# Patient Record
Sex: Male | Born: 1975 | Race: Black or African American | Hispanic: No | Marital: Married | Smoking: Former smoker
Health system: Southern US, Community
[De-identification: ages and names within clinical notes are randomized; demographics above are authoritative.]

## PROBLEM LIST (undated history)

## (undated) DIAGNOSIS — R569 Unspecified convulsions: Secondary | ICD-10-CM

## (undated) DIAGNOSIS — I639 Cerebral infarction, unspecified: Secondary | ICD-10-CM

## (undated) HISTORY — PX: ROTATOR CUFF REPAIR: SHX139

---

## 2012-01-31 ENCOUNTER — Emergency Department (HOSPITAL_COMMUNITY): Payer: BC Managed Care – PPO

## 2012-01-31 ENCOUNTER — Inpatient Hospital Stay (HOSPITAL_BASED_OUTPATIENT_CLINIC_OR_DEPARTMENT_OTHER)
Admission: EM | Admit: 2012-01-31 | Discharge: 2012-02-04 | DRG: 810 | Disposition: A | Discharge status: Home / Self Care | Payer: BC Managed Care – PPO | Attending: Neurosurgery | Admitting: Neurosurgery

## 2012-01-31 ENCOUNTER — Emergency Department (HOSPITAL_BASED_OUTPATIENT_CLINIC_OR_DEPARTMENT_OTHER): Payer: BC Managed Care – PPO

## 2012-01-31 ENCOUNTER — Encounter (HOSPITAL_BASED_OUTPATIENT_CLINIC_OR_DEPARTMENT_OTHER): Payer: Self-pay | Admitting: *Deleted

## 2012-01-31 ENCOUNTER — Inpatient Hospital Stay (HOSPITAL_COMMUNITY): Payer: BC Managed Care – PPO

## 2012-01-31 DIAGNOSIS — R569 Unspecified convulsions: Secondary | ICD-10-CM | POA: Diagnosis not present

## 2012-01-31 DIAGNOSIS — G936 Cerebral edema: Secondary | ICD-10-CM | POA: Diagnosis present

## 2012-01-31 DIAGNOSIS — R4701 Aphasia: Secondary | ICD-10-CM | POA: Diagnosis present

## 2012-01-31 DIAGNOSIS — I619 Nontraumatic intracerebral hemorrhage, unspecified: Principal | ICD-10-CM | POA: Diagnosis present

## 2012-01-31 DIAGNOSIS — R29810 Facial weakness: Secondary | ICD-10-CM | POA: Diagnosis present

## 2012-01-31 DIAGNOSIS — G9389 Other specified disorders of brain: Secondary | ICD-10-CM | POA: Diagnosis present

## 2012-01-31 DIAGNOSIS — F17200 Nicotine dependence, unspecified, uncomplicated: Secondary | ICD-10-CM | POA: Diagnosis present

## 2012-01-31 LAB — PROTIME-INR: INR: 0.91 (ref 0.00–1.49)

## 2012-01-31 LAB — COMPREHENSIVE METABOLIC PANEL
AST: 27 U/L (ref 0–37)
Albumin: 4 g/dL (ref 3.5–5.2)
Alkaline Phosphatase: 58 U/L (ref 39–117)
BUN: 16 mg/dL (ref 6–23)
BUN: 16 mg/dL (ref 6–23)
CO2: 25 mEq/L (ref 19–32)
Calcium: 9.1 mg/dL (ref 8.4–10.5)
Chloride: 102 mEq/L (ref 96–112)
GFR calc Af Amer: >90 mL/min (ref >90)
GFR calc non Af Amer: 76 mL/min — ABNORMAL LOW (ref >90)
Glucose, Bld: 102 mg/dL — ABNORMAL HIGH (ref 70–99)
Potassium: 3.5 mEq/L (ref 3.5–5.1)
Total Bilirubin: 0.2 mg/dL — ABNORMAL LOW (ref 0.3–1.2)
Total Protein: 7.1 g/dL (ref 6.0–8.3)

## 2012-01-31 LAB — DIFFERENTIAL
Basophils Relative: 0 % (ref 0–1)
Lymphocytes Relative: 32 % (ref 12–46)
Monocytes Relative: 6 % (ref 3–12)
Neutro Abs: 5.8 10*3/uL (ref 1.7–7.7)
Neutrophils Relative %: 58 % (ref 43–77)

## 2012-01-31 LAB — RAPID URINE DRUG SCREEN, HOSP PERFORMED
Amphetamines: NOT DETECTED
Benzodiazepines: NOT DETECTED
Opiates: NOT DETECTED
Tetrahydrocannabinol: NOT DETECTED

## 2012-01-31 LAB — CBC
HCT: 41.4 % (ref 39.0–52.0)
Hemoglobin: 14.4 g/dL (ref 13.0–17.0)
MCHC: 34.8 g/dL (ref 30.0–36.0)
RBC: 4.69 MIL/uL (ref 4.22–5.81)
WBC: 9.9 10*3/uL (ref 4.0–10.5)

## 2012-01-31 LAB — MRSA PCR SCREENING: MRSA by PCR: POSITIVE — AB

## 2012-01-31 LAB — CK TOTAL AND CKMB (NOT AT ARMC): Total CK: 499 U/L — ABNORMAL HIGH (ref 7–232)

## 2012-01-31 LAB — APTT: aPTT: 31 seconds (ref 24–37)

## 2012-01-31 LAB — GLUCOSE, CAPILLARY: Glucose-Capillary: 110 mg/dL — ABNORMAL HIGH (ref 70–99)

## 2012-01-31 MED ORDER — SODIUM CHLORIDE 0.9 % IJ SOLN
3.0000 mL | INTRAMUSCULAR | Status: AC
  Filled 2012-01-31: qty 3

## 2012-01-31 MED ORDER — SODIUM CHLORIDE 0.9 % IV SOLN
INTRAVENOUS | Status: DC
  Administered 2012-02-01 – 2012-02-02 (×2): via INTRAVENOUS
  Administered 2012-02-03: 50 mL/h via INTRAVENOUS

## 2012-01-31 MED ORDER — PANTOPRAZOLE SODIUM 40 MG IV SOLR
40.0000 mg | Freq: Every day | INTRAVENOUS | Status: DC
  Administered 2012-01-31: 40 mg via INTRAVENOUS
  Filled 2012-01-31 (×2): qty 40

## 2012-01-31 MED ORDER — SODIUM CHLORIDE 0.9 % IV SOLN
500.0000 mg | Freq: Two times a day (BID) | INTRAVENOUS | Status: DC
  Administered 2012-01-31 – 2012-02-01 (×3): 500 mg via INTRAVENOUS
  Filled 2012-01-31 (×4): qty 5

## 2012-01-31 MED ORDER — SODIUM CHLORIDE 0.9 % IJ SOLN
3.0000 mL | Freq: Two times a day (BID) | INTRAMUSCULAR | Status: DC
  Administered 2012-01-31 (×2): 3 mL via INTRAVENOUS
  Administered 2012-02-01: 10 mL via INTRAVENOUS
  Administered 2012-02-01: 3 mL via INTRAVENOUS
  Administered 2012-02-02: 10 mL via INTRAVENOUS
  Administered 2012-02-02 – 2012-02-04 (×2): 3 mL via INTRAVENOUS

## 2012-01-31 MED ORDER — DEXAMETHASONE SODIUM PHOSPHATE 4 MG/ML IJ SOLN
2.0000 mg | Freq: Four times a day (QID) | INTRAMUSCULAR | Status: DC
  Administered 2012-01-31 – 2012-02-03 (×13): 2 mg via INTRAVENOUS
  Filled 2012-01-31 (×4): qty 0.5
  Filled 2012-01-31 (×2): qty 1
  Filled 2012-01-31 (×2): qty 0.5
  Filled 2012-01-31: qty 1
  Filled 2012-01-31 (×2): qty 0.5
  Filled 2012-01-31 (×2): qty 1
  Filled 2012-01-31 (×4): qty 0.5

## 2012-01-31 MED ORDER — LABETALOL HCL 5 MG/ML IV SOLN: 10.0000 mg | INTRAVENOUS | Status: DC | PRN

## 2012-01-31 MED ORDER — ACETAMINOPHEN 325 MG PO TABS: 650.0000 mg | ORAL_TABLET | ORAL | Status: DC | PRN

## 2012-01-31 MED ORDER — DIAZEPAM 5 MG PO TABS
5.0000 mg | ORAL_TABLET | Freq: Once | ORAL | Status: AC
  Administered 2012-01-31: 5 mg via ORAL

## 2012-01-31 MED ORDER — SENNOSIDES-DOCUSATE SODIUM 8.6-50 MG PO TABS
1.0000 | ORAL_TABLET | Freq: Two times a day (BID) | ORAL | Status: DC
  Administered 2012-01-31 – 2012-02-04 (×6): 1 via ORAL
  Filled 2012-01-31 (×8): qty 1

## 2012-01-31 MED ORDER — SODIUM CHLORIDE 0.9 % IV SOLN
1000.0000 mg | Freq: Once | INTRAVENOUS | Status: AC
  Administered 2012-01-31: 1000 mg via INTRAVENOUS
  Filled 2012-01-31: qty 20

## 2012-01-31 MED ORDER — LORAZEPAM 2 MG/ML IJ SOLN
INTRAMUSCULAR | Status: AC
  Administered 2012-01-31: 01:00:00
  Filled 2012-01-31: qty 1

## 2012-01-31 MED ORDER — ACETAMINOPHEN 650 MG RE SUPP: 650.0000 mg | RECTAL | Status: DC | PRN

## 2012-01-31 MED ORDER — GADOBENATE DIMEGLUMINE 529 MG/ML IV SOLN
20.0000 mL | Freq: Once | INTRAVENOUS | Status: AC
  Administered 2012-01-31: 20 mL via INTRAVENOUS

## 2012-01-31 MED ORDER — DIAZEPAM 5 MG PO TABS
ORAL_TABLET | ORAL | Status: AC
  Administered 2012-01-31: 5 mg
  Filled 2012-01-31: qty 1

## 2012-01-31 MED ORDER — DIAZEPAM 5 MG PO TABS
ORAL_TABLET | ORAL | Status: AC
  Filled 2012-01-31: qty 1

## 2012-01-31 MED ORDER — ONDANSETRON HCL 4 MG/2ML IJ SOLN: 4.0000 mg | Freq: Four times a day (QID) | INTRAMUSCULAR | Status: DC | PRN

## 2012-01-31 NOTE — Progress Notes (Signed)
Chaplain responded to page for patient being brought in to [facility] from [facility].  Patient calm and resting.  Wife states they have strong spiritual support...3 of their parents are ministers. Offered refreshments and support. Patient being admitted. Will follow up as needed.

## 2012-01-31 NOTE — Evaluation (Signed)
SLP has reviewed and agrees with student's note below.  Duane Smith Duane Smith M.Ed CCC-SLP Pager [phone]  01/31/2012  

## 2012-01-31 NOTE — ED Notes (Addendum)
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 While in transit with [facility] pt seized and was given dilatin and ativan. +r facial droop  GCS 15.

## 2012-01-31 NOTE — Evaluation (Signed)
Speech Language Pathology Evaluation Patient Details Name: Duane Smith MRN: [id number] DOB: 22-Jan-1976 Today's Date: 01/31/2012 Time: 1100-1115 SLP Time Calculation (min): 15 min  Problem List: There is no problem list on file for this patient.  Past Medical History: History reviewed. No pertinent past medical history. Past Surgical History:  Past Surgical History  Procedure Date  . Rotator cuff repair    HPI:  Duane Smith is a 36 y.o. male who presented to the Emergency Department complaining of sudden, progressively worsening, aphasia onset yesterday (10:30PM). 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  MRI pending.  The pt reports he smoked one cigar and drank four beers yesterday evening (01/30/12) while at a friends residence. The pt informs he experienced a period of inability to articulate his words and speak normally which last 20 seconds in duration. Following the first experience, the pt returned home, where he reports he experienced a second episode of aphasia at 11:45PM, which prompted his visit to [facility].     Assessment / Plan / Recommendation Clinical Impression  Pt. seen for speech-language-cognitive assessment.  Pt. is drowsy after having Valium in preparation for MRI today.  Per pt., pt.'s wife and mother, speech/language/cognition is back to baseline. Cognition appeared WFL at bediside with tasks assessed.  Pt. presents with a right side facial droop and reduced min-mild dysarthria. Overall, facial droop/speech intelligibility did not decrease his ability to communicate effectively.  SLP will provide oral-motor exercises to practice independently.  No ST recommended at this time.      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[employer])   Cognition  Overall Cognitive Status: Appears within functional limits for tasks assessed Arousal/Alertness: Awake/alert Orientation Level: Oriented X4 Attention: Sustained Memory: Appears intact Awareness: Appears intact Problem Solving: Appears intact Safety/Judgment: Appears intact    Comprehension  Auditory Comprehension Overall Auditory Comprehension: Appears within functional limits for tasks assessed Yes/No Questions: Within Functional Limits Commands: Within Functional Limits Conversation: Simple Visual Recognition/Discrimination Discrimination: Not tested Reading Comprehension Reading Status: Not tested    Expression Expression Primary Mode of Expression: Verbal Verbal Expression Overall Verbal Expression: Appears within functional limits for tasks assessed Initiation: No impairment Level of Generative/Spontaneous Verbalization: Conversation Repetition: No impairment Naming: No impairment Pragmatics: No impairment Written Expression Written Expression: Not tested   Oral / Motor Oral Motor/Sensory Function Overall Oral Motor/Sensory Function: Impaired Labial ROM: Reduced right Labial Symmetry: Abnormal symmetry right Labial Strength: Within Functional Limits Labial Sensation: Within Functional Limits Lingual ROM: Within Functional Limits Lingual Symmetry: Within Functional Limits Lingual Strength: Within Functional Limits Lingual Sensation: Within Functional Limits Facial ROM: Reduced right Facial Symmetry: Right droop Facial Strength: Reduced Facial Sensation: Reduced Velum: Within Functional Limits Mandible: Within Functional Limits Motor Speech Overall Motor Speech: Appears within functional limits for tasks assessed Respiration: Within functional limits Phonation: Normal Resonance: Within functional limits Articulation: Within functional  limitis Intelligibility: Intelligibility reduced Word: 75-100% accurate Phrase: 75-100% accurate Sentence: 75-100% accurate Conversation: 75-100% accurate Motor Planning: Witnin functional limits   GO     Milagros Loll, Lynette Topete 01/31/2012, 1:02 PM

## 2012-01-31 NOTE — ED Provider Notes (Addendum)
History   This chart was scribed for Shuntel Fishburn Smitty Cords, MD by Sofie Rower. The patient was seen in room MH10/MH10 and the patient's care was started at 12:46AM  Level 5 Caveat: Immediate Medical Intervention.    CSN: [id number]  Arrival date & time 01/31/12  Duane Smith   First MD Initiated Contact with Patient 01/31/12 [phone]      Chief Complaint  Patient presents with  . Aphasia    (Consider location/radiation/quality/duration/timing/severity/associated sxs/prior treatment) The history is provided by the patient and a friend. The history is limited by the condition of the patient (need for urgent intervention). No language interpreter was used.    Duane Smith is a 36 y.o. male who presents to the Emergency Department complaining of sudden, progressively worsening, aphasia onset yesterday (10:30PM). The pt reports he smoked one cigar and drank four beers yesterday evening (01/30/12) while at a friends residence. The pt informs he experienced a period of inability to articulate his words and speak normally which last 20 seconds in duration. Following the first experience, the pt returned home, where he reports he experienced a second episode of aphasia at 11:45PM, which prompted his visit to [facility].  The pt denies headache, neck pain, trauma, abdominal pain, nausea, vomiting, difficulty breathing, visual difficulty, and difficulty speaking.       History reviewed. No pertinent past medical history.  History reviewed. No pertinent past surgical history.  No family history on file.  History  Substance Use Topics  . Smoking status: Current Everyday Smoker  . Smokeless tobacco: Not on file  . Alcohol Use: Yes     occ      Review of Systems  Unable to perform ROS Neurological: Positive for facial asymmetry and speech difficulty.  All other systems reviewed and are negative.    Allergies  Review of patient's allergies indicates no known allergies.  Home Medications  No current  outpatient prescriptions on file.  BP 155/91  Pulse 100  Temp 97.8 F (36.6 C) (Oral)  Resp 18  Ht 5\' 9"  (1.753 m)  Wt 245 lb (111.131 kg)  BMI 36.18 kg/m2  SpO2 98%  Physical Exam  Nursing note and vitals reviewed. Constitutional: He is oriented to person, place, and time. He appears well-developed and well-nourished.  HENT:  Head: Normocephalic and atraumatic.  Nose: Nose normal.  Mouth/Throat: Oropharynx is clear and moist.  Eyes: Conjunctivae and EOM are normal. Pupils are equal, round, and reactive to light.  Neck: Normal range of motion. Neck supple.  Cardiovascular: Normal rate, regular rhythm and normal heart sounds.   Pulmonary/Chest: Effort normal and breath sounds normal.  Abdominal: Soft. Bowel sounds are normal. There is no tenderness. There is no rebound and no guarding.  Musculoskeletal: Normal range of motion.  Neurological: He is alert and oriented to person, place, and time. He has normal reflexes. He displays normal reflexes.       5/5 strength detected at all extremities. Facial droop located at the right side of the face.  Tick of the tongue  Skin: Skin is warm and dry.  Psychiatric: He has a normal mood and affect. His behavior is normal.    ED Course  Procedures (including critical care time)  DIAGNOSTIC STUDIES: Oxygen Saturation is 98% on room air, normal by my interpretation.    COORDINATION OF CARE:    12:50AM- Treatment plan discussed with patient. Pt agrees to treatment.   Labs Reviewed - No data to display No results found.   No diagnosis  found.    MDM  Case D/w Dr. Roseanne Reno of neurology will see in the ED cancel code stroke send to ED.  Spoke with Dr. Roseanne Reno 2 additional times to update him on patient's condition  On the way out of the trauma bay patient was witnessed to have had 20-30 second tonic clonic seizure.  Resolved,  2 mg ativan given fosphenytoin loaded.  Recontacted Dr/ Roseanne Reno of neurology.    Updated Dr. Oletta Lamas in the  ED regarding patient's condition as [facility] left building regarding change in patients status  Attempted call to Dr. Blanche East but unable to complete due to active seizure.  Spoke with Dr.  Blanche East later following transfer regarding patient's symptoms.  He would like chest XRAY UDS and LFTs added  I personally performed the services described in this documentation, which was scribed in my presence. The recorded information has been reviewed and considered.   Date: 01/31/2012  Rate: 103  Rhythm: sinus tachycardia  QRS Axis: normal  Intervals: normal  ST/T Wave abnormalities: normal  Conduction Disutrbances:none  Narrative Interpretation:   Old EKG Reviewed: none available  MDM Reviewed: nursing note and vitals Interpretation: labs, ECG and CT scan Total time providing critical care: 30-74 minutes. This excludes time spent performing separately reportable procedures and services. Consults: neurology and neurosurgery  CRITICAL CARE Performed by: Jasmine Awe   Total critical care time: 30 minutes  Critical care time was exclusive of separately billable procedures and treating other patients.  Critical care was necessary to treat or prevent imminent or life-threatening deterioration.  Critical care was time spent personally by me on the following activities: development of treatment plan with patient and/or surrogate as well as nursing, discussions with consultants, evaluation of patient's response to treatment, examination of patient, obtaining history from patient or surrogate, ordering and performing treatments and interventions, ordering and review of laboratory studies, ordering and review of radiographic studies, pulse oximetry and re-evaluation of patient's condition.       Jasmine Awe, MD 01/31/12 [phone]

## 2012-01-31 NOTE — H&P (Signed)
Subjective: Duane Smith is a 36 y.o. male who presented to the Emergency Department complaining of sudden, progressively worsening, aphasia onset yesterday (10:30PM). The pt reports he smoked one cigar and drank four beers yesterday evening (01/30/12) while at a friends residence. The pt informs he experienced a period of inability to articulate his words and speak normally which last 20 seconds in duration. Following the first experience, the pt returned home, where he reports he experienced a second episode of aphasia at 11:45PM, which prompted his visit to [facility].  The pt denies headache, neck pain, trauma, abdominal pain, nausea, vomiting, difficulty breathing, visual difficulty, and difficulty speaking. Pt c/o right facial weakness - had 1 sz in ER   There are no active problems to display for this patient.  History reviewed. No pertinent past medical history.  Past Surgical History  Procedure Date  . Rotator cuff repair     No prescriptions prior to admission   No Known Allergies  History  Substance Use Topics  . Smoking status: Current Everyday Smoker  . Smokeless tobacco: Not on file  . Alcohol Use: Yes     occ    No family history on file.  Review of Systems Otherwise negative  Objective: Vital signs in last 24 hours: Temp:  [97.8 F (36.6 C)-98.3 F (36.8 C)] 98.2 F (36.8 C) (09/09 0400) Pulse Rate:  [38-105] 89  (09/09 0500) Resp:  [15-26] 23  (09/09 0500) BP: (97-155)/(50-98) 112/69 mmHg (09/09 0500) SpO2:  [79 %-100 %] 99 % (09/09 0500) Weight:  [111.131 kg (245 lb)-117 kg (257 lb 15 oz)] 117 kg (257 lb 15 oz) (09/09 0400)  AAOx3, speech sl slurred - right facial weakness - otherwise CN  Intact Motor - 5/5, sensation intact , no drift  Data Review Lab Results  Component Value Date   WBC 9.9 01/31/2012   HGB 14.4 01/31/2012   HCT 41.4 01/31/2012   PLT 247 01/31/2012   GLUCOSE 102* 01/31/2012   ALT 23 01/31/2012   AST 32 01/31/2012   NA 140 01/31/2012   K 4.0 01/31/2012   CL  104 01/31/2012   CREATININE 1.12 01/31/2012   BUN 16 01/31/2012   CO2 26 01/31/2012   INR 0.91 01/31/2012     CT HEAD WITHOUT CONTRAST  Technique: Contiguous axial images were obtained from the base of  the skull through the vertex without contrast.  Comparison: None.  Findings: 2.1 cm rounded mass in the posterior left frontal lobe  with a mild amount of adjacent white matter low density. This is  predominately high density, measuring up to 87 HU in density.  There is an anterior low density fluid component as well. There is  minimal shift of midline structures to the right, measuring 1 mm.  The ventricles are normal in size. No additional intracranial  abnormalities are seen. Unremarkable bones. Left maxillary and  bilateral ethmoid sinus mucosal thickening. There is also a small  amount of left frontal sinus mucosal thickening or retained  secretions.  IMPRESSION:  1. 2.1 cm rounded hemorrhagic and cystic mass in the posterior  left frontal lobe. 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 Further evaluation with  pre and postcontrast magnetic resonance imaging is recommended.  This was discussed with Dr. Terressa Koyanagi at 0104 hours.  2. 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    Assessment/Plan:  Pt with Left frontal ICH , ? Into underlying mass -  Admit icu - get MRI   Clydene Fake, MD 01/31/2012 7:11 AM

## 2012-01-31 NOTE — ED Provider Notes (Signed)
MSE performed.   Brain mass from [facility].  I spoke to Dr Nicanor Alcon and she has already spoke with NEU and NSG who are coming to see PT.  2:19 AM I spoke to Dr Delbert Phenix NSG admit.   PT evaluated, is on Dilantin for Sz PTA. Now is awake alert no focal deficits, speech clear, MAE x 4.   CXR and labs added and plan NSG admit  2:45 AM d/w Dr Phoebe Perch, requests admit to 3100 ICU and keppra 500mg  Q 12 and MR brain with and without  Sunnie Nielsen, MD 01/31/12 0246

## 2012-01-31 NOTE — ED Notes (Addendum)
Pt. States that he was over at a friends house this evening. States he had a few drinks of etoh. States at 1030pm he could not "get his words out" states this lasted  20 seconds and he was able to talk. When he got home around 2345 he had another episode. Pt. Presents with a right sided facial droop. Speech is clear at present. Grips equal bilateral on exam. Pupils 2 brisk bilateral. Code Stroke called from Triage. Called Dr. Nicanor Alcon to assess pt. In triage.

## 2012-02-01 ENCOUNTER — Inpatient Hospital Stay (HOSPITAL_COMMUNITY): Payer: BC Managed Care – PPO

## 2012-02-01 MED ORDER — LEVETIRACETAM 500 MG PO TABS
500.0000 mg | ORAL_TABLET | Freq: Two times a day (BID) | ORAL | Status: DC
  Filled 2012-02-01 (×2): qty 1

## 2012-02-01 MED ORDER — LEVETIRACETAM 500 MG PO TABS
1000.0000 mg | ORAL_TABLET | Freq: Two times a day (BID) | ORAL | Status: DC
  Administered 2012-02-01: 1000 mg via ORAL
  Filled 2012-02-01 (×3): qty 2

## 2012-02-01 MED ORDER — PANTOPRAZOLE SODIUM 40 MG PO TBEC
40.0000 mg | DELAYED_RELEASE_TABLET | Freq: Every day | ORAL | Status: DC
  Administered 2012-02-01 – 2012-02-03 (×3): 40 mg via ORAL
  Filled 2012-02-01 (×4): qty 1

## 2012-02-01 MED ORDER — SODIUM CHLORIDE 0.9 % IV SOLN
1000.0000 mg | INTRAVENOUS | Status: AC
  Administered 2012-02-01: 1000 mg via INTRAVENOUS
  Filled 2012-02-01: qty 10

## 2012-02-01 NOTE — Evaluation (Signed)
Clinical/Bedside Swallow Evaluation Patient Details  Name: Duane Smith MRN: [id number] Date of Birth: 1975/11/12  Today's Date: 02/01/2012 Time: 1415-1430 SLP Time Calculation (min): 15 min  Past Medical History: History reviewed. No pertinent past medical history. Past Surgical History:  Past Surgical History  Procedure Date  . Rotator cuff repair    HPI:  Duane Smith is a 36 y.o. male who presented to the Emergency Department complaining of sudden, progressively worsening, aphasia onset yesterday (10:30PM). 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  MRI pending. The pt informs he experienced a period of inability to articulate his words and speak normally which last 20 seconds in duration. Following the first experience, the pt returned home, where he reports he experienced a second episode of aphasia at 11:45PM, which prompted his visit to [facility].  RN received BSE due to right labial leakage when drinking and difficulty masticating on right side oral cavity.  Experiencing increased transient difficulty expressing himself today.     Assessment / Plan / Recommendation Clinical Impression  Pt. demonstrates decreased right labial ROM and sensation and reported spillage from right side while drinking and difficulty biting his sandwhich at lunch.  Pt. independently compensates by masticating on the left side of oral cavity and drinking from cup on right side.  No s/s penetration or aspiration during assessment.  SLP educated pt. to continue swallow precautions.  Recommending he continue a regular texture diet and thin liquids.  No follow up ST was recommended yesterday after speech-cognitive assessement.  SLP/pt./RN noted increased and inconsistent expressing his thoughts and increased dysarthria today.  SLP will follow up for dysphagia and request repeat speech assessment if needed.  SLP will give oral-motor exercises for independent practice.    Aspiration Risk  Mild    Diet Recommendation  Regular;Thin liquid   Liquid Administration via: Cup;Straw Medication Administration: Whole meds with liquid Supervision: Patient able to self feed;Intermittent supervision to cue for compensatory strategies Compensations: Slow rate;Small sips/bites;Check for pocketing (masticate on left side oral cavity) Postural Changes and/or Swallow Maneuvers: Seated upright 90 degrees    Other  Recommendations Oral Care Recommendations: Oral care BID   Follow Up Recommendations  None    Frequency and Duration min 2x/week  2 weeks        SLP Swallow Goals Patient will consume recommended diet without observed clinical signs of aspiration with: Independent assistance Patient will utilize recommended strategies during swallow to increase swallowing safety with: Independent assistance   Swallow Study Prior Functional Status  Type of Home: Apartment Lives With: Spouse Available Help at Discharge: Family;Available PRN/intermittently Vocation: Full time employment    General HPI: Duane Smith is a 36 y.o. male who presented to the Emergency Department complaining of sudden, progressively worsening, aphasia onset yesterday (10:30PM). 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  MRI pending. The pt informs he experienced a period of inability to articulate his words and speak normally which last 20 seconds in duration. Following the first experience, the pt returned home, where he reports he experienced a second episode of aphasia at 11:45PM, which prompted his visit to [facility].  RN received BSE due to right labial leakage when drinking and difficulty masticating on right side oral cavity.  Experiencing increased transient difficulty expressing himself today.   Type of Study: Bedside swallow evaluation Diet Prior to this Study: Regular;Thin liquids Temperature Spikes Noted: No Respiratory Status: Room air History of Recent Intubation: No Behavior/Cognition: Alert;Cooperative;Pleasant mood Oral Cavity -  Dentition: Adequate natural dentition Self-Feeding Abilities:  Able to feed self Patient Positioning: Upright in bed Baseline Vocal Quality: Clear Volitional Cough: Strong Volitional Swallow: Able to elicit    Oral/Motor/Sensory Function Overall Oral Motor/Sensory Function: Impaired Labial ROM: Reduced right Labial Symmetry: Abnormal symmetry right Labial Strength: Reduced Labial Sensation: Reduced Lingual ROM: Within Functional Limits Lingual Symmetry: Within Functional Limits Lingual Strength: Within Functional Limits Lingual Sensation: Within Functional Limits Facial ROM: Reduced right Facial Symmetry: Right droop Facial Strength: Reduced Facial Sensation: Reduced Velum: Within Functional Limits Mandible: Within Functional Limits   Ice Chips Ice chips: Not tested   Thin Liquid Thin Liquid: Within functional limits Presentation: Cup;Straw    Nectar Thick Nectar Thick Liquid: Not tested   Honey Thick Honey Thick Liquid: Not tested   Puree Puree: Not tested   Solid       Solid: Within functional limits       Royce Macadamia M.Ed [employer] [phone]  02/01/2012

## 2012-02-01 NOTE — Procedures (Signed)
History: 36 yo M with left frontal IPH and transient episodes of aphasia.   Sedation: None  Background: The majority of the recording was in drowsiness and sleep, but during periods of wakefulness, there is a well defined posterior dominant rhythm of 9 Hz that attenuates with eye opening. There is an incerase in slow activity associated with perdios of drowsiness.   EEG Diagnosis: Normal waking and sleep EEG.   Clinical Interpretation: This was a normal EEG. There was no seizure or seizure predisposition recorded on this study.   Ritta Slot, MD [facility] [phone]

## 2012-02-01 NOTE — Progress Notes (Signed)
I agree with the following treatment note after reviewing documentation.   Johnston, Karalee Hauter Brynn   OTR/L Pager: [phone] Office: [phone] .   

## 2012-02-01 NOTE — Progress Notes (Signed)
Occupational Therapy Evaluation Patient Details Name: Duane Smith MRN: [id number] DOB: 1976/02/04 Today's Date: 02/01/2012 Time: 4098-1191 OT Time Calculation (min): 26 min  OT Assessment / Plan / Recommendation Clinical Impression  36 yo old male with sudden onset aphasia and L frontal ICH. Pt. independent for ADL tasks, no current need for acute OT    OT Assessment  Patient does not need any further OT services    Follow Up Recommendations  No OT follow up    Barriers to Discharge      Equipment Recommendations  None recommended by OT    Recommendations for Other Services    Frequency       Precautions / Restrictions Precautions Precautions: None Restrictions Weight Bearing Restrictions: No   Pertinent Vitals/Pain Heart rate maintained in the 120-130's during treatment. Pt. Tolerated tx. well stating "I do not feel tired" at the end of the session.    ADL  Grooming: Performed;Wash/dry hands;Independent Where Assessed - Grooming: Unsupported standing Upper Body Bathing: Simulated;Right arm;Left arm;Independent Where Assessed - Upper Body Bathing: Unsupported sitting Upper Body Dressing: Performed;Independent Where Assessed - Upper Body Dressing: Unsupported standing Toilet Transfer: Performed;Independent Toilet Transfer Method: Sit to Barista: Regular height toilet Toileting - Clothing Manipulation and Hygiene: Performed;Independent Where Assessed - Toileting Clothing Manipulation and Hygiene: Standing Tub/Shower Transfer: Simulated;Independent Tub/Shower Transfer Method: Ambulating Transfers/Ambulation Related to ADLs: Pt. able to ambulate indpendently, heart rate maintained in the 120-130's  ADL Comments: Pt. able to comlpete all ADL's independently. Needs rest breaks due to high heart rate with minimal exertion.     OT Diagnosis:    OT Problem List:   OT Treatment Interventions:     OT Goals    Visit Information  Last OT Received On:  02/01/12 PT/OT Co-Evaluation/Treatment: Yes    Subjective Data  Subjective: "I don't feel tired" In regards to explaining to him about monitoring his heart rate  Patient Stated Goal: To go home and be able to work out again (specifically running)   Prior Functioning  Vision/Perception  Home Living Lives With: Spouse Available Help at Discharge: Family;Available PRN/intermittently Type of Home: Apartment Home Access: Stairs to enter Entrance Stairs-Number of Steps: lots of steps 3rd floor apt.  Entrance Stairs-Rails: Can reach both Home Layout: One level Bathroom Shower/Tub: Curtain Firefighter: Handicapped height Bathroom Accessibility: Yes How Accessible: Accessible via walker Home Adaptive Equipment: None Prior Function Level of Independence: Independent Able to Take Stairs?: Yes Driving: Yes Vocation: Full time employment Comments: enjoys golfing, working out 5x/week, Naval architect: Expressive difficulties (aphasic/slurred speech ) Dominant Hand: Right   Perception Perception: Within Functional Limits  Cognition  Overall Cognitive Status: Appears within functional limits for tasks assessed/performed Arousal/Alertness: Awake/alert Orientation Level: Appears intact for tasks assessed Behavior During Session: [facility] for tasks performed    Extremity/Trunk Assessment Right Upper Extremity Assessment RUE ROM/Strength/Tone: Within functional levels RUE Sensation: WFL - Light Touch RUE Coordination: WFL - gross/fine motor Left Upper Extremity Assessment LUE ROM/Strength/Tone: Within functional levels LUE Sensation: WFL - Light Touch LUE Coordination: WFL - gross/fine motor Right Lower Extremity Assessment RLE Sensation: WFL - Light Touch Left Lower Extremity Assessment LLE Sensation: WFL - Light Touch Trunk Assessment Trunk Assessment: Normal   Mobility  Shoulder Instructions  Bed Mobility Bed Mobility: Supine to Sit;Sit to Supine;Sitting -  Scoot to [place] of Bed Sitting - Scoot to Edge of Bed: 7: Independent Sit to Supine: 7: Independent Transfers Transfers: Sit to Stand;Stand to Sit Sit to Stand:  7: Independent Stand to Sit: 7: Independent                 End of Session OT - End of Session Activity Tolerance: Patient tolerated treatment well (Heart rate maintained in the 120-130's through out session ) Patient left: in bed;with call bell/phone within reach;with family/visitor present Nurse Communication: Mobility status  GO     Cleora Fleet 02/01/2012, 11:31 AM

## 2012-02-01 NOTE — Consult Note (Signed)
Reason for Consult: Seizures in the setting of IPH Referring Physician: Palumbo-Rasch  CC: Aphasia  History is obtained from: Patient  HPI: Duane Smith is an 36 y.o. male who 2 days ago began having intermittent episodes of aphasia. He states that he'll be in his normal speech pattern, then suddenly will have 30-40 seconds of being unable to say what he wants to say. Yesterday, he began having a right facial droop as well.  [place] [facility] [place] he [place] CT which showed a relatively superficial bleed in the left posterior frontal lobe. He was transferred to [facility], and admitted to neurosurgery given concern for hemorrhagic tumor. He received an MRI MRA which did not show a clear etiology for his hemorrhage.  He continues to have intermittent episodes, states that he had 4 today.   ROS: An 11 point ROS was performed and is negative except as noted in the HPI.  History reviewed. No pertinent past medical history.  Family History: No history of seizure  Social History: Tob: Smokes  Exam: Current vital signs: BP 108/58  Pulse 63  Temp 98.3 F (36.8 C) (Oral)  Resp 17  Ht 5\' 9"  (1.753 m)  Wt 117 kg (257 lb 15 oz)  BMI 38.09 kg/m2  SpO2 96% Vital signs in last 24 hours: Temp:  [97.5 F (36.4 C)-98.3 F (36.8 C)] 98.3 F (36.8 C) (09/10 1205) Pulse Rate:  [52-131] 63  (09/10 1500) Resp:  [13-24] 17  (09/10 1500) BP: (85-127)/(32-95) 108/58 mmHg (09/10 1500) SpO2:  [93 %-100 %] 96 % (09/10 1500)  General: In bed, no apparent distress CV: Regular rate and rhythm Mental Status: Patient is awake, alert, oriented to person, place, month, year, and situation. Immediate and remote memory are intact. Patient is able to give a clear and coherent history. Cranial Nerves: II: Visual Fields are full. Pupils are equal, round, and reactive to light.  Discs were not well visualized. III,IV, VI: EOMI without ptosis or diploplia.  V,VII: Facial  sensation symmetric. He has a right lower facial droop. VIII: hearing is intact to voice X: Uvula elevates symmetrically XI: Shoulder shrug is symmetric. XII: tongue is midline without atrophy or fasciculations.  Motor: Tone is normal. Bulk is normal. 5/5 strength was present in all four extremities. He does have a very mild right arm drift that is only apparent with head shaking Sensory: Sensation is symmetric to light touch and temperature in the arms and legs. Deep Tendon Reflexes: 2+ and symmetric in the biceps and patellae.  Cerebellar: FNF and intact bilaterally Gait: Not tested as he is connected multiple monitors in the ICU which would make it difficult to walk in the  I have reviewed labs in epic and the results pertinent to this consultation are: Coags were normal  I have reviewed the images obtained: MRI head-hemorrhage and left posterior frontal lobe with mild edema surrounding it.  Impression: 36 year old male with intraparenchymal hemorrhage. The location does not seem typical for hypertensive bleed. Though there is no explanation on his MRI for his bleed, it may be reasonable to repeat this after giving time for the blood resorb.  I do suspect that his transient episodes of aphasia represent seizure activity in the setting of his acute bleed. His already on Keppra, however I will increase this and give acute dose.  Recommendations: 1) Keppra 1000 mg IV 2) increase Keppra to 1000 mg twice a day. 3) Will obtain EEG.  Ritta Slot, MD [facility] [phone]

## 2012-02-01 NOTE — Progress Notes (Addendum)
@@@  D/C from PT due to no further needs@@@   02/01/12 1140  PT Visit Information  Last PT Received On 02/01/12  Assistance Needed +1  PT Time Calculation  PT Start Time 1046  PT Stop Time 1112  PT Time Calculation (min) 26 min  Subjective Data  Subjective I've never had anything happen to me close to this  Patient Stated Goal Back Independent and back to work  Precautions  Precautions None  Restrictions  Weight Bearing Restrictions No  Home Living  Lives With Spouse  Available Help at Discharge Family;Available PRN/intermittently  Type of Home Apartment  Home Access Stairs to enter  Entrance Stairs-Number of Steps lots of steps 3rd floor apt.   Entrance Stairs-Rails Can reach both  Home Layout One level  Bathroom Shower/Tub Curtain  Bathroom Toilet Handicapped height  Bathroom Accessibility Yes  How Accessible Accessible via walker  Home Adaptive Equipment None  Prior Function  Level of Independence Independent  Able to Take Stairs? Yes  Driving Yes  Vocation Full time employment  Communication  Communication Expressive difficulties  Cognition  Overall Cognitive Status Appears within functional limits for tasks assessed/performed  Arousal/Alertness Awake/alert  Orientation Level Appears intact for tasks assessed  Behavior During Session [facility] for tasks performed  Right Upper Extremity Assessment  RUE ROM/Strength/Tone WFL  Left Upper Extremity Assessment  LUE ROM/Strength/Tone WFL  Right Lower Extremity Assessment  RLE ROM/Strength/Tone WFL  RLE Sensation WFL - Light Touch  RLE Coordination WFL - gross/fine motor  Left Lower Extremity Assessment  LLE ROM/Strength/Tone WFL  LLE Sensation WFL - Light Touch  LLE Coordination WFL - gross/fine motor  Trunk Assessment  Trunk Assessment Normal  Bed Mobility  Bed Mobility Supine to Sit;Sit to Supine;Sitting - Scoot to Edge of Bed  Supine to Sit 7: Independent  Sitting - Scoot to Edge of Bed 7: Independent  Sit to  Supine 7: Independent  Transfers  Transfers Sit to Stand;Stand to Sit  Sit to Stand 7: Independent  Stand to Sit 7: Independent  Ambulation/Gait  Ambulation/Gait Assistance 7: Independent  Ambulation Distance (Feet) 350 Feet  Assistive device None  Ambulation/Gait Assistance Details Steady and I even with higher level balance challenges including scanning in >270 degree arc, quick directional changes, backing up, stepping over object.  Gait Pattern WFL  Stairs No  Wheelchair Mobility  Wheelchair Mobility No  Modified Rankin (Stroke Patients Only)  Pre-Morbid Rankin Score 0  Modified Rankin 1  PT - End of Session  Activity Tolerance Patient tolerated treatment well  Patient left in bed;with call bell/phone within reach;with family/visitor present  Nurse Communication Mobility status  PT Assessment  Clinical Impression Statement pt admitted with an occipital (mass with bleed or area of hemorrhage) per MRI.  Mobility at or approching baseline per pt with no overt deviation by assessment.  No further PT needs.  D/C  PT Recommendation/Assessment Patent does not need any further PT services  No Skilled PT Patient at baseline level of functioning;Patient will have necessary level of assist by caregiver at discharge;Patient is independent with all acitivity/mobility  PT Recommendation  Follow Up Recommendations No PT follow up  Equipment Recommended None recommended by PT  PT General Charges  $$ ACUTE PT VISIT 1 Procedure  PT Evaluation  $Initial PT Evaluation Tier II 1 Procedure  PT Treatments  $Gait Training 8-22 mins  Written Expression  Dominant Hand Right   02/01/2012  [place] Bing, PT [phone] (pager)

## 2012-02-01 NOTE — Progress Notes (Signed)
Subjective: Patient reports Right afcial weakness - some slurred speach  Objective: Vital signs in last 24 hours: Temp:  [97.5 F (36.4 C)-98.5 F (36.9 C)] 97.5 F (36.4 C) (09/10 0400) Pulse Rate:  [52-96] 74  (09/10 0600) Resp:  [13-23] 20  (09/10 0600) BP: (85-128)/(32-95) 116/63 mmHg (09/10 0600) SpO2:  [93 %-100 %] 96 % (09/10 0600)  Intake/Output from previous day: 09/09 0701 - 09/10 0700 In: -  Out: 3250 [Urine:3250] Intake/Output this shift:    no change in exam  Lab Results:  [facility] 01/31/12 0055  WBC 9.9  HGB 14.4  HCT 41.4  PLT 247   BMET  Basename 01/31/12 0213 01/31/12 0055  NA 140 140  K 4.0 3.5  CL 104 102  CO2 26 25  GLUCOSE 102* 112*  BUN 16 16  CREATININE 1.12 1.20  CALCIUM 9.1 9.4    Studies/Results: Ct Head Wo Contrast  01/31/2012  *RADIOLOGY REPORT*  Clinical Data: Right facial droop.  Transient dysphasia.  CT HEAD WITHOUT CONTRAST  Technique:  Contiguous axial images were obtained from the base of the skull through the vertex without contrast.  Comparison: None.  Findings: 2.1 cm rounded mass in the posterior left frontal lobe with a mild amount of adjacent white matter low density.  This is predominately high density, measuring up to 87 HU in density. There is an anterior low density fluid component as well.  There is minimal shift of midline structures to the right, measuring 1 mm.  The ventricles are normal in size.  No additional intracranial abnormalities are seen.  Unremarkable bones.  Left maxillary and bilateral ethmoid sinus mucosal thickening.  There is also a small amount of left frontal sinus mucosal thickening or retained secretions.  IMPRESSION:  1.  2.1 cm rounded hemorrhagic and cystic mass in the posterior left frontal lobe.  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  Further evaluation with pre and postcontrast magnetic  resonance imaging is recommended. This was discussed with Dr. Terressa Koyanagi at 0104 hours. 2.  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   Original Report Authenticated By: Darrol Angel, M.D.    [name]  01/31/2012  *RADIOLOGY REPORT*  Clinical Data:  Sudden onset of aphasia which has been progressively getting worse.  MRI HEAD WITHOUT AND WITH CONTRAST  MRA HEAD WITHOUT CONTRAST  Technique:  Multiplanar, multiecho pulse sequences of the brain and surrounding structures were obtained without and with intravenous contrast.  Angiographic images of the head were obtained using MRA technique without contrast.  Contrast: 20mL MULTIHANCE GADOBENATE DIMEGLUMINE 529 MG/ML IV SOLN  Comparison:  CT head without contrast 01/31/2012.  MRI HEAD WITHOUT AND WITH CONTRAST  Findings:  A posterior left frontal hemorrhage measures 2.0 x 2.7 x 2.5 cm.  There is focal mass effect and surrounding edema.  A fluid level is present within an anterior component compatible with the blood products.  The postcontrast images demonstrate a thin rim of enhancement along the lateral and superior aspect of the hemorrhage.  No discrete enhancing mass lesion is evident.  No other pathologic enhancement is evident.  There is 1-2 mm of left to right midline shift is noted at the foramen of Monro.  No other hemorrhage is evident. Flow is present in the major intracranial arteries.  The globes and orbits are intact.  A fluid level  is present in the left maxillary sinus.  Mild mucosal thickening is present in the ethmoid air cells and frontal sinuses.  IMPRESSION:  1.  2.7 cm posterior left frontal lobe hemorrhage without any definitive underlying mass. Follow-up MRI of the brain without and with contrast at to 3 months may be useful in evaluating for nerve lining mass after partial resolution of the blood products. 2.  Focal mass effect and 1-2 millimeters of midline shift.  MRA HEAD  Findings: The internal carotid  arteries are within normal limits bilaterally.  The A1 and M1 segments are normal.  No definite anterior communicating artery is present.  Vertebral arteries are codominant.  The basilar artery is within normal limits.  The left AICA is dominant.  The right posterior cerebral artery originates from the basilar tip.  The left posterior cerebral arteries predominately of fetal type with a small left P1 segment contributing.  The PCA branch vessels are within normal limits.  IMPRESSION: Normal variant MRA circle of Willis without evidence for significant proximal stenosis, aneurysm, or branch vessel occlusion.   Original Report Authenticated By: Jamesetta Orleans. MATTERN, M.D.    [name]  01/31/2012  *RADIOLOGY REPORT*  Clinical Data:  Sudden onset of aphasia which has been progressively getting worse.  MRI HEAD WITHOUT AND WITH CONTRAST  MRA HEAD WITHOUT CONTRAST  Technique:  Multiplanar, multiecho pulse sequences of the brain and surrounding structures were obtained without and with intravenous contrast.  Angiographic images of the head were obtained using MRA technique without contrast.  Contrast: 20mL MULTIHANCE GADOBENATE DIMEGLUMINE 529 MG/ML IV SOLN  Comparison:  CT head without contrast 01/31/2012.  MRI HEAD WITHOUT AND WITH CONTRAST  Findings:  A posterior left frontal hemorrhage measures 2.0 x 2.7 x 2.5 cm.  There is focal mass effect and surrounding edema.  A fluid level is present within an anterior component compatible with the blood products.  The postcontrast images demonstrate a thin rim of enhancement along the lateral and superior aspect of the hemorrhage.  No discrete enhancing mass lesion is evident.  No other pathologic enhancement is evident.  There is 1-2 mm of left to right midline shift is noted at the foramen of Monro.  No other hemorrhage is evident. Flow is present in the major intracranial arteries.  The globes and orbits are intact.  A fluid level is present in the left  maxillary sinus.  Mild mucosal thickening is present in the ethmoid air cells and frontal sinuses.  IMPRESSION:  1.  2.7 cm posterior left frontal lobe hemorrhage without any definitive underlying mass. Follow-up MRI of the brain without and with contrast at to 3 months may be useful in evaluating for nerve lining mass after partial resolution of the blood products. 2.  Focal mass effect and 1-2 millimeters of midline shift.  MRA HEAD  Findings: The internal carotid arteries are within normal limits bilaterally.  The A1 and M1 segments are normal.  No definite anterior communicating artery is present.  Vertebral arteries are codominant.  The basilar artery is within normal limits.  The left AICA is dominant.  The right posterior cerebral artery originates from the basilar tip.  The left posterior cerebral arteries predominately of fetal type with a small left P1 segment contributing.  The PCA branch vessels are within normal limits.  IMPRESSION: Normal variant MRA circle of Willis without evidence for significant proximal stenosis, aneurysm, or branch vessel occlusion.   Original Report Authenticated By: Jamesetta Orleans. MATTERN,  M.D.    Dg Chest Portable 1 View  01/31/2012  *RADIOLOGY REPORT*  Clinical Data: A aphasia and facial droop.  PORTABLE CHEST - 1 VIEW  Comparison: None.  Findings: Shallow inspiration. The heart size and pulmonary vascularity are normal. The lungs appear clear and expanded without focal air space disease or consolidation. No blunting of the costophrenic angles.  No pneumothorax.  Mediastinal contours appear intact.  IMPRESSION: No evidence of active pulmonary disease.   Original Report Authenticated By: Marlon Pel, M.D.     Assessment/Plan: Pt with ICH - MRI negative - CPM - ?floor in am, increase activity  LOS: 1 day     Tatiyanna Lashley R, MD 02/01/2012, 7:53 AM

## 2012-02-01 NOTE — Progress Notes (Signed)
EEG COMPLETED

## 2012-02-02 ENCOUNTER — Inpatient Hospital Stay (HOSPITAL_COMMUNITY): Payer: BC Managed Care – PPO

## 2012-02-02 ENCOUNTER — Encounter (HOSPITAL_COMMUNITY): Payer: Self-pay | Admitting: Radiology

## 2012-02-02 DIAGNOSIS — G939 Disorder of brain, unspecified: Secondary | ICD-10-CM

## 2012-02-02 DIAGNOSIS — R569 Unspecified convulsions: Secondary | ICD-10-CM

## 2012-02-02 LAB — PHENYTOIN LEVEL, TOTAL: Phenytoin Lvl: 21.3 ug/mL — ABNORMAL HIGH (ref 10.0–20.0)

## 2012-02-02 MED ORDER — SODIUM CHLORIDE 0.9 % IV SOLN
1500.0000 mg | Freq: Once | INTRAVENOUS | Status: AC
  Administered 2012-02-02: 1500 mg via INTRAVENOUS
  Filled 2012-02-02: qty 30

## 2012-02-02 MED ORDER — MUPIROCIN 2 % EX OINT
1.0000 "application " | TOPICAL_OINTMENT | Freq: Two times a day (BID) | CUTANEOUS | Status: DC
  Administered 2012-02-02 – 2012-02-04 (×5): 1 via NASAL
  Filled 2012-02-02: qty 22

## 2012-02-02 MED ORDER — LACOSAMIDE 50 MG PO TABS
50.0000 mg | ORAL_TABLET | Freq: Two times a day (BID) | ORAL | Status: DC
  Administered 2012-02-02 – 2012-02-04 (×4): 50 mg via ORAL
  Filled 2012-02-02 (×4): qty 1

## 2012-02-02 MED ORDER — IOHEXOL 300 MG/ML  SOLN
150.0000 mL | Freq: Once | INTRAMUSCULAR | Status: AC | PRN
  Administered 2012-02-02: 65 mL via INTRA_ARTERIAL

## 2012-02-02 MED ORDER — SODIUM CHLORIDE 0.9 % IV SOLN: INTRAVENOUS | Status: AC

## 2012-02-02 MED ORDER — LEVETIRACETAM 750 MG PO TABS
2000.0000 mg | ORAL_TABLET | Freq: Two times a day (BID) | ORAL | Status: DC
  Administered 2012-02-02 – 2012-02-04 (×4): 2000 mg via ORAL
  Filled 2012-02-02 (×6): qty 1

## 2012-02-02 MED ORDER — MIDAZOLAM HCL 5 MG/5ML IJ SOLN
INTRAMUSCULAR | Status: DC | PRN
  Administered 2012-02-02: 1 mg via INTRAVENOUS

## 2012-02-02 MED ORDER — SODIUM CHLORIDE 0.9 % IV SOLN
500.0000 mg | INTRAVENOUS | Status: AC
  Administered 2012-02-02: 500 mg via INTRAVENOUS
  Filled 2012-02-02: qty 5

## 2012-02-02 MED ORDER — CHLORHEXIDINE GLUCONATE CLOTH 2 % EX PADS
6.0000 | MEDICATED_PAD | Freq: Every day | CUTANEOUS | Status: DC
  Administered 2012-02-02 – 2012-02-04 (×3): 6 via TOPICAL

## 2012-02-02 MED ORDER — PHENYTOIN SODIUM 50 MG/ML IJ SOLN
100.0000 mg | Freq: Three times a day (TID) | INTRAMUSCULAR | Status: DC
  Administered 2012-02-02 – 2012-02-03 (×3): 100 mg via INTRAVENOUS
  Filled 2012-02-02 (×6): qty 2

## 2012-02-02 MED ORDER — SODIUM CHLORIDE 0.9 % IV SOLN
300.0000 mg | Freq: Once | INTRAVENOUS | Status: AC
  Administered 2012-02-02: 300 mg via INTRAVENOUS
  Filled 2012-02-02: qty 30

## 2012-02-02 MED ORDER — LORAZEPAM 2 MG/ML IJ SOLN
INTRAMUSCULAR | Status: AC
  Filled 2012-02-02: qty 1

## 2012-02-02 MED ORDER — DIAZEPAM 5 MG/ML IJ SOLN
INTRAMUSCULAR | Status: AC
  Filled 2012-02-02: qty 2

## 2012-02-02 MED ORDER — LACOSAMIDE 50 MG PO TABS
100.0000 mg | ORAL_TABLET | Freq: Once | ORAL | Status: AC
  Administered 2012-02-02: 100 mg via ORAL
  Filled 2012-02-02: qty 2

## 2012-02-02 MED ORDER — FENTANYL CITRATE 0.05 MG/ML IJ SOLN
INTRAMUSCULAR | Status: DC | PRN
  Administered 2012-02-02: 25 ug via INTRAVENOUS

## 2012-02-02 MED ORDER — LEVETIRACETAM 750 MG PO TABS
1500.0000 mg | ORAL_TABLET | Freq: Two times a day (BID) | ORAL | Status: DC
  Administered 2012-02-02: 1500 mg via ORAL
  Filled 2012-02-02 (×2): qty 2

## 2012-02-02 NOTE — Progress Notes (Signed)
Speech Language Pathology Dysphagia Treatment Patient Details Name: Duane Smith MRN: [id number] DOB: [birth year] Today's Date: 02/02/2012 Time: 0201-0209 SLP Time Calculation (min): 8 min  Assessment / Plan / Recommendation Clinical Impression  Pt. seen for dysphagia treatment focusing on facilitation of oral phase of swallow via exercises. Pt. not observed with POs due to being NPO for procedure.  SLP introduced exercises to strengthen labial/lingual/facial/ musculature via written handout. Minimal verbal cueing required for Pt. to implement exercises. SLP will f/u tomorrow to further assess swallow safety function.      Diet Recommendation  Continue with Current Diet: Regular;Thin liquid    SLP Plan Continue with current plan of care      Swallowing Goals  SLP Swallowing Goals Patient will consume recommended diet without observed clinical signs of aspiration with: Independent assistance Swallow Study Goal #1 - Progress: Progressing toward goal Patient will utilize recommended strategies during swallow to increase swallowing safety with: Independent assistance Swallow Study Goal #2 - Progress: Progressing toward goal  General Temperature Spikes Noted: No Respiratory Status: Room air Behavior/Cognition: Alert;Cooperative;Pleasant mood Oral Cavity - Dentition: Adequate natural dentition Patient Positioning: Upright in bed  Oral Cavity - Oral Hygiene Does patient have any of the following "at risk" factors?: None of the above Brush patient's teeth BID with toothbrush (using toothpaste with fluoride): Yes Patient is AT RISK - Oral Care Protocol followed (see row info): Yes   Dysphagia Treatment Treatment focused on: Facilitation of oral preparatory phase;Facilitation of oral phase Treatment Methods/Modalities: Skilled observation Patient observed directly with PO's: No Reason PO's not observed: NPO for procedure/test Type of cueing: Verbal Amount of cueing: Minimal   GO      Duane Smith 02/02/2012, 3:43 PM

## 2012-02-02 NOTE — H&P (Signed)
Chief Complaint: Frontal hemorrhage and aphasia Referring Physician:Sethi HPI: Duane Smith is an 36 y.o. male  who presented to the Emergency Department complaining of sudden, progressively worsening, aphasia onset yesterday (10:30PM). The pt reports he smoked one cigar and drank four beers yesterday evening (01/30/12) while at a friends residence. The pt informs he experienced a period of inability to articulate his words and speak normally which last 20 seconds in duration. Following the first experience, the pt returned home, where he reports he experienced a second episode of aphasia at 11:45PM, which prompted his visit to [facility].  The pt denies headache, neck pain, trauma, abdominal pain, nausea, vomiting, difficulty breathing, visual difficulty, and difficulty speaking. Pt c/o right facial weakness - had 1 sz in ER  He is found to have evidence of frontal hemorrhage on the left side. Request for formal cerebral angiogram is made to assist workup. Pt was otherwise healthy prior to this event. He experience similar event during my interview that lasted about 10 seconds of aphasia.  Past Medical History: History reviewed. No pertinent past medical history.  Past Surgical History:  Past Surgical History  Procedure Date  . Rotator cuff repair     Family History: No family history on file.  Social History:  reports that he has been smoking.  He does not have any smokeless tobacco history on file. He reports that he drinks alcohol. He reports that he does not use illicit drugs.  Allergies: No Known Allergies  Medications: Scheduled Meds:   . Chlorhexidine Gluconate Cloth  6 each Topical Q0600  . dexamethasone  2 mg Intravenous Q6H  . levetiracetam  1,000 mg Intravenous NOW  . levetiracetam  500 mg Intravenous NOW  . levETIRAcetam  1,500 mg Oral Q12H  . mupirocin ointment  1 application Nasal BID  . pantoprazole  40 mg Oral QHS  . phenytoin (DILANTIN) IV  1,500 mg Intravenous Once  .  phenytoin (DILANTIN) IV  100 mg Intravenous Q8H  . senna-docusate  1 tablet Oral BID  . sodium chloride  3 mL Intravenous Q12H   Continuous Infusions:   . sodium chloride 50 mL/hr at 02/01/12 1712   PRN Meds:.acetaminophen, acetaminophen, labetalol, ondansetron (ZOFRAN) IV     Please HPI for pertinent positives, otherwise complete 10 system ROS negative.  Physical Exam: Blood pressure 107/48, pulse 80, temperature 98.1 F (36.7 C), temperature source Oral, resp. rate 13, height 5\' 9"  (1.753 m), weight 257 lb 15 oz (117 kg), SpO2 98.00%. Body mass index is 38.09 kg/(m^2).   General Appearance:  Alert, cooperative, no distress, appears stated age  Head:  Normocephalic, without obvious abnormality, atraumatic  ENT: Unremarkable  Neck: Supple, symmetrical, trachea midline, no adenopathy, thyroid: not enlarged, symmetric, no tenderness/mass/nodules  Lungs:   Clear to auscultation bilaterally, no w/r/r, respirations unlabored without use of accessory muscles.  Chest Wall:  No tenderness or deformity  Heart:  Regular rate and rhythm, S1, S2 normal, no murmur, rub or gallop. Carotids 2+ without bruit.  Abdomen:   Soft, non-tender, non distended. Bowel sounds active all four quadrants,  no masses, no organomegaly.  Extremities: Extremities normal, atraumatic, no cyanosis or edema  Pulses: 2+ and symmetric femoral and pedal pulses  Skin: Skin color, texture, turgor normal, no rashes or lesions  Neurologic: Normal affect, no gross deficits.   No results found for this or any previous visit (from the past 48 hour(s)). [name]  01/31/2012  *RADIOLOGY REPORT*  Clinical Data:  Sudden  onset of aphasia which has been progressively getting worse.  MRI HEAD WITHOUT AND WITH CONTRAST  MRA HEAD WITHOUT CONTRAST  Technique:  Multiplanar, multiecho pulse sequences of the brain and surrounding structures were obtained without and with intravenous contrast.  Angiographic images of the  head were obtained using MRA technique without contrast.  Contrast: 20mL MULTIHANCE GADOBENATE DIMEGLUMINE 529 MG/ML IV SOLN  Comparison:  CT head without contrast 01/31/2012.  MRI HEAD WITHOUT AND WITH CONTRAST  Findings:  A posterior left frontal hemorrhage measures 2.0 x 2.7 x 2.5 cm.  There is focal mass effect and surrounding edema.  A fluid level is present within an anterior component compatible with the blood products.  The postcontrast images demonstrate a thin rim of enhancement along the lateral and superior aspect of the hemorrhage.  No discrete enhancing mass lesion is evident.  No other pathologic enhancement is evident.  There is 1-2 mm of left to right midline shift is noted at the foramen of Monro.  No other hemorrhage is evident. Flow is present in the major intracranial arteries.  The globes and orbits are intact.  A fluid level is present in the left maxillary sinus.  Mild mucosal thickening is present in the ethmoid air cells and frontal sinuses.  IMPRESSION:  1.  2.7 cm posterior left frontal lobe hemorrhage without any definitive underlying mass. Follow-up MRI of the brain without and with contrast at to 3 months may be useful in evaluating for nerve lining mass after partial resolution of the blood products. 2.  Focal mass effect and 1-2 millimeters of midline shift.  MRA HEAD  Findings: The internal carotid arteries are within normal limits bilaterally.  The A1 and M1 segments are normal.  No definite anterior communicating artery is present.  Vertebral arteries are codominant.  The basilar artery is within normal limits.  The left AICA is dominant.  The right posterior cerebral artery originates from the basilar tip.  The left posterior cerebral arteries predominately of fetal type with a small left P1 segment contributing.  The PCA branch vessels are within normal limits.  IMPRESSION: Normal variant MRA circle of Willis without evidence for significant proximal stenosis, aneurysm, or  branch vessel occlusion.   Original Report Authenticated By: Jamesetta Orleans. MATTERN, M.D.    [name]  01/31/2012  *RADIOLOGY REPORT*  Clinical Data:  Sudden onset of aphasia which has been progressively getting worse.  MRI HEAD WITHOUT AND WITH CONTRAST  MRA HEAD WITHOUT CONTRAST  Technique:  Multiplanar, multiecho pulse sequences of the brain and surrounding structures were obtained without and with intravenous contrast.  Angiographic images of the head were obtained using MRA technique without contrast.  Contrast: 20mL MULTIHANCE GADOBENATE DIMEGLUMINE 529 MG/ML IV SOLN  Comparison:  CT head without contrast 01/31/2012.  MRI HEAD WITHOUT AND WITH CONTRAST  Findings:  A posterior left frontal hemorrhage measures 2.0 x 2.7 x 2.5 cm.  There is focal mass effect and surrounding edema.  A fluid level is present within an anterior component compatible with the blood products.  The postcontrast images demonstrate a thin rim of enhancement along the lateral and superior aspect of the hemorrhage.  No discrete enhancing mass lesion is evident.  No other pathologic enhancement is evident.  There is 1-2 mm of left to right midline shift is noted at the foramen of Monro.  No other hemorrhage is evident. Flow is present in the major intracranial arteries.  The globes and orbits are intact.  A fluid level is present in the left maxillary sinus.  Mild mucosal thickening is present in the ethmoid air cells and frontal sinuses.  IMPRESSION:  1.  2.7 cm posterior left frontal lobe hemorrhage without any definitive underlying mass. Follow-up MRI of the brain without and with contrast at to 3 months may be useful in evaluating for nerve lining mass after partial resolution of the blood products. 2.  Focal mass effect and 1-2 millimeters of midline shift.  MRA HEAD  Findings: The internal carotid arteries are within normal limits bilaterally.  The A1 and M1 segments are normal.  No definite anterior communicating  artery is present.  Vertebral arteries are codominant.  The basilar artery is within normal limits.  The left AICA is dominant.  The right posterior cerebral artery originates from the basilar tip.  The left posterior cerebral arteries predominately of fetal type with a small left P1 segment contributing.  The PCA branch vessels are within normal limits.  IMPRESSION: Normal variant MRA circle of Willis without evidence for significant proximal stenosis, aneurysm, or branch vessel occlusion.   Original Report Authenticated By: Jamesetta Orleans. MATTERN, M.D.     Assessment/Plan (L)frontal hemorrhage, without definitive mass For cerebral angio today to assess for underlying vascular abnormality. Labs all ok. Discussed procedure with pt and family, including risks, complications. Sedation risks also reviewed. Consent signed in chart.  Brayton El PA-C 02/02/2012, 10:07 AM

## 2012-02-02 NOTE — Progress Notes (Signed)
Subjective: Patient reports multiple focal sz  Objective: Vital signs in last 24 hours: Temp:  [97.8 F (36.6 C)-99.1 F (37.3 C)] 98.1 F (36.7 C) (09/11 0721) Pulse Rate:  [48-131] 57  (09/11 0700) Resp:  [9-24] 12  (09/11 0700) BP: (84-132)/(42-81) 110/62 mmHg (09/11 0700) SpO2:  [96 %-100 %] 97 % (09/11 0700)  Intake/Output from previous day: 09/10 0701 - 09/11 0700 In: 2093 [P.O.:720; I.V.:1153; IV Piggyback:220] Out: 525 [Urine:525] Intake/Output this shift:    Right facial droop worse than at admission,  ? because of focal sz? -   sl right drift - otherwise stable  Lab Results:  [facility] 01/31/12 0055  WBC 9.9  HGB 14.4  HCT 41.4  PLT 247   BMET  Basename 01/31/12 0213 01/31/12 0055  NA 140 140  K 4.0 3.5  CL 104 102  CO2 26 25  GLUCOSE 102* 112*  BUN 16 16  CREATININE 1.12 1.20  CALCIUM 9.1 9.4    Studies/Results: [name]  01/31/2012  *RADIOLOGY REPORT*  Clinical Data:  Sudden onset of aphasia which has been progressively getting worse.  MRI HEAD WITHOUT AND WITH CONTRAST  MRA HEAD WITHOUT CONTRAST  Technique:  Multiplanar, multiecho pulse sequences of the brain and surrounding structures were obtained without and with intravenous contrast.  Angiographic images of the head were obtained using MRA technique without contrast.  Contrast: 20mL MULTIHANCE GADOBENATE DIMEGLUMINE 529 MG/ML IV SOLN  Comparison:  CT head without contrast 01/31/2012.  MRI HEAD WITHOUT AND WITH CONTRAST  Findings:  A posterior left frontal hemorrhage measures 2.0 x 2.7 x 2.5 cm.  There is focal mass effect and surrounding edema.  A fluid level is present within an anterior component compatible with the blood products.  The postcontrast images demonstrate a thin rim of enhancement along the lateral and superior aspect of the hemorrhage.  No discrete enhancing mass lesion is evident.  No other pathologic enhancement is evident.  There is 1-2 mm of left to right midline  shift is noted at the foramen of Monro.  No other hemorrhage is evident. Flow is present in the major intracranial arteries.  The globes and orbits are intact.  A fluid level is present in the left maxillary sinus.  Mild mucosal thickening is present in the ethmoid air cells and frontal sinuses.  IMPRESSION:  1.  2.7 cm posterior left frontal lobe hemorrhage without any definitive underlying mass. Follow-up MRI of the brain without and with contrast at to 3 months may be useful in evaluating for nerve lining mass after partial resolution of the blood products. 2.  Focal mass effect and 1-2 millimeters of midline shift.  MRA HEAD  Findings: The internal carotid arteries are within normal limits bilaterally.  The A1 and M1 segments are normal.  No definite anterior communicating artery is present.  Vertebral arteries are codominant.  The basilar artery is within normal limits.  The left AICA is dominant.  The right posterior cerebral artery originates from the basilar tip.  The left posterior cerebral arteries predominately of fetal type with a small left P1 segment contributing.  The PCA branch vessels are within normal limits.  IMPRESSION: Normal variant MRA circle of Willis without evidence for significant proximal stenosis, aneurysm, or branch vessel occlusion.   Original Report Authenticated By: Jamesetta Orleans. MATTERN, M.D.    [name]  01/31/2012  *RADIOLOGY REPORT*  Clinical Data:  Sudden onset of aphasia which has been progressively getting  worse.  MRI HEAD WITHOUT AND WITH CONTRAST  MRA HEAD WITHOUT CONTRAST  Technique:  Multiplanar, multiecho pulse sequences of the brain and surrounding structures were obtained without and with intravenous contrast.  Angiographic images of the head were obtained using MRA technique without contrast.  Contrast: 20mL MULTIHANCE GADOBENATE DIMEGLUMINE 529 MG/ML IV SOLN  Comparison:  CT head without contrast 01/31/2012.  MRI HEAD WITHOUT AND WITH CONTRAST   Findings:  A posterior left frontal hemorrhage measures 2.0 x 2.7 x 2.5 cm.  There is focal mass effect and surrounding edema.  A fluid level is present within an anterior component compatible with the blood products.  The postcontrast images demonstrate a thin rim of enhancement along the lateral and superior aspect of the hemorrhage.  No discrete enhancing mass lesion is evident.  No other pathologic enhancement is evident.  There is 1-2 mm of left to right midline shift is noted at the foramen of Monro.  No other hemorrhage is evident. Flow is present in the major intracranial arteries.  The globes and orbits are intact.  A fluid level is present in the left maxillary sinus.  Mild mucosal thickening is present in the ethmoid air cells and frontal sinuses.  IMPRESSION:  1.  2.7 cm posterior left frontal lobe hemorrhage without any definitive underlying mass. Follow-up MRI of the brain without and with contrast at to 3 months may be useful in evaluating for nerve lining mass after partial resolution of the blood products. 2.  Focal mass effect and 1-2 millimeters of midline shift.  MRA HEAD  Findings: The internal carotid arteries are within normal limits bilaterally.  The A1 and M1 segments are normal.  No definite anterior communicating artery is present.  Vertebral arteries are codominant.  The basilar artery is within normal limits.  The left AICA is dominant.  The right posterior cerebral artery originates from the basilar tip.  The left posterior cerebral arteries predominately of fetal type with a small left P1 segment contributing.  The PCA branch vessels are within normal limits.  IMPRESSION: Normal variant MRA circle of Willis without evidence for significant proximal stenosis, aneurysm, or branch vessel occlusion.   Original Report Authenticated By: Jamesetta Orleans. MATTERN, M.D.     Assessment/Plan: Pt with [facility] -  Continuing focal sz - appreciate neurology input -   Keep in ICU today - CT in am  LOS:  2 days     Shadonna Benedick R, MD 02/02/2012, 7:51 AM

## 2012-02-02 NOTE — Progress Notes (Signed)
Stroke Team Progress Note  HISTORY Duane Smith is an 36 y.o. male who 2 days ago began having intermittent episodes of aphasia. He states that he'll be in his normal speech pattern, then suddenly will have 30-40 seconds of being unable to say what he wants to say. Yesterday, he began having a right facial droop as well.  He presented to the ER at [facility] [place] in there he had a CT which showed a relatively superficial bleed in the left posterior frontal lobe. He was transferred to [facility], and admitted to neurosurgery given concern for hemorrhagic tumor. He received an MRI MRA which did not show a clear etiology for his hemorrhage. He continues to have intermittent episodes, states that he had 4 today.  Patient was not a TPA candidate secondary to hemorrhage. He was admitted for further evaluation and treatment.  SUBJECTIVE His mother is at the bedside.  Overall he feels his condition is stable. He denies any preceeding headaches, seizures or past neurological symptoms. No f/h of brain hemorrhage. Nephew had seizures at age 81.had few partial seizures overnight despite keppra hence dilantin added.  OBJECTIVE Most recent Vital Signs: Filed Vitals:   02/02/12 0500 02/02/12 0600 02/02/12 0700 02/02/12 0721  BP: 109/55 118/70 110/62   Pulse: 49 53 57   Temp:    98.1 F (36.7 C)  TempSrc:    Oral  Resp: 9 15 12    Height:      Weight:      SpO2: 96% 97% 97%    CBG (last 3)   Basename 01/31/12 0106  GLUCAP 110*   Intake/Output from previous day: 09/10 0701 - 09/11 0700 In: 2093 [P.O.:720; I.V.:1153; IV Piggyback:220] Out: 525 [Urine:525]  IV Fluid Intake:     . sodium chloride 50 mL/hr at 02/01/12 1712    MEDICATIONS    . dexamethasone  2 mg Intravenous Q6H  . levetiracetam  1,000 mg Intravenous NOW  . levetiracetam  500 mg Intravenous NOW  . levETIRAcetam  1,500 mg Oral Q12H  . pantoprazole  40 mg Oral QHS  . phenytoin (DILANTIN) IV  1,500 mg Intravenous Once  .  phenytoin (DILANTIN) IV  100 mg Intravenous Q8H  . senna-docusate  1 tablet Oral BID  . sodium chloride  3 mL Intravenous Q12H  . DISCONTD: levetiracetam  500 mg Intravenous Q12H  . DISCONTD: levETIRAcetam  1,000 mg Oral Q12H  . DISCONTD: levETIRAcetam  500 mg Oral Q12H  . DISCONTD: pantoprazole (PROTONIX) IV  40 mg Intravenous QHS   PRN:  acetaminophen, acetaminophen, labetalol, ondansetron (ZOFRAN) IV  Diet:  General thin liquids Activity:  Ambulate patient  DVT Prophylaxis:  SCDs  CLINICALLY SIGNIFICANT STUDIES Basic Metabolic Panel:  Lab 01/31/12 1610 01/31/12 0055  NA 140 140  K 4.0 3.5  CL 104 102  CO2 26 25  GLUCOSE 102* 112*  BUN 16 16  CREATININE 1.12 1.20  CALCIUM 9.1 9.4  MG -- --  PHOS -- --   Liver Function Tests:  Lab 01/31/12 0213 01/31/12 0055  AST 32 27  ALT 23 21  ALKPHOS 50 58  BILITOT 0.2* 0.2*  PROT 7.1 7.5  ALBUMIN 3.7 4.0   CBC:  Lab 01/31/12 0055  WBC 9.9  NEUTROABS 5.8  HGB 14.4  HCT 41.4  MCV 88.3  PLT 247   Coagulation:  Lab 01/31/12 0055  LABPROT 12.4  INR 0.91   Cardiac Enzymes:  Lab 01/31/12 0055  CKTOTAL 499*  CKMB 2.9  CKMBINDEX --  TROPONINI <0.30   Urinalysis: No results found for this basename: COLORURINE:2,APPERANCEUR:2,LABSPEC:2,PHURINE:2,GLUCOSEU:2,HGBUR:2,BILIRUBINUR:2,KETONESUR:2,PROTEINUR:2,UROBILINOGEN:2,NITRITE:2,LEUKOCYTESUR:2 in the last 168 hours Lipid Panel No results found for this basename: chol, trig, hdl, cholhdl, vldl, ldlcalc   HgbA1C  No results found for this basename: HGBA1C    Urine Drug Screen:     Component Value Date/Time   LABOPIA NONE DETECTED 01/31/2012 0755   COCAINSCRNUR NONE DETECTED 01/31/2012 0755   LABBENZ NONE DETECTED 01/31/2012 0755   AMPHETMU NONE DETECTED 01/31/2012 0755   THCU NONE DETECTED 01/31/2012 0755   LABBARB NONE DETECTED 01/31/2012 0755    Alcohol Level: No results found for this basename: ETH:2 in the last 168 hours  CT of the brain  01/31/2012 1. 2.1 cm rounded  hemorrhagic and cystic mass in the posterior left frontal lobe. 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 Further evaluation with pre and postcontrast magnetic resonance imaging is recommended.  2. 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  MRI of the brain  01/31/2012   1.  2.7 cm posterior left frontal lobe hemorrhage without any definitive underlying mass. Follow-up MRI of the brain without and with contrast at to 3 months may be useful in evaluating for nerve lining mass after partial resolution of the blood products. 2.  Focal mass effect and 1-2 millimeters of midline shift.  MRA of the brain  01/31/2012   Normal variant MRA circle of Willis without evidence for significant proximal stenosis, aneurysm, or branch vessel occlusion.    CXR  01/31/2012  No evidence of active pulmonary disease.  EKG  sinus tachycardia.   EEG Normal waking and sleep EEG.   Therapy Recommendations PT - none; OT - none; ST - none  Physical Exam   Pleasant young healthy african [place] male not in distress.Awake alert. Afebrile. Head is nontraumatic. Neck is supple without bruit. Hearing is normal. Cardiac exam no murmur or gallop. Lungs are clear to auscultation. Distal pulses are well felt.  Neurological exam :   Awake  Alert oriented x 3. Normal speech and languageAble to name, repeat and comprehend well. Fluent speech.Fundi not visualized. Visual fields and acuity appear normal..eye movements full without nystagmus. Face asymmetric with moderate right lower face weak.. Tongue midline. Normal strength, tone, reflexes and coordination. Normal sensation. Gait deferred.  ASSESSMENT Mr. Duane Smith is a 36 y.o. male presenting with worsening aphasia secondary to repititive simple partial seizures. Imaging confirms a left frontal hemorrhagic lesion with mixed old cystic and fresh hemorrhagic  components and minimal edema .Differential includes AVM,,vascular tumor doubt cystic glioma.  Hospital day # 2  TREATMENT/PLAN  Cerebral angio today to rule out underlying vascular abnormality.  Strict control of HT.  Add dilantin 300mg  daily to keppra 1500mg  twice daily for seizures control.Check EEg results.Check peak dilantin level today and trough level in am.  -D/W patient, mom and answered questions. This patient is critically ill and at significant risk of neurological worsening, death and care requires constant monitoring of vital signs, hemodynamics,respiratory and cardiac monitoring,review of multiple databases, neurological assessment, discussion with family, other specialists and medical decision making of high complexity. I spent 30 minutes of neurocritical care time  in the care of  this patient.   Annie Main, MSN, RN, ANVP-BC, ANP-BC, GNP-BC [facility] Pager: [phone] 02/02/2012 8:17 AM  Scribe for Dr. Delia Heady, Stroke Center Medical Director, who  has personally reviewed chart, pertinent data, examined the patient and developed the plan of care. Pager:  [phone]

## 2012-02-02 NOTE — ED Notes (Signed)
MD at bedside. Dr Corliss Skains speaking with pt and family.

## 2012-02-02 NOTE — Procedures (Signed)
S/P 4 vesssel cerebral arteriogram. RT CFA approach. Preliminary findings. 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 With displacement of posterior  Vessels. 2. No aneurysms,AVM or DAVF or dissections seen. Venous outflow WNLs

## 2012-02-02 NOTE — ED Notes (Signed)
5 [place] Exoseal closure by [employer]

## 2012-02-02 NOTE — ED Notes (Signed)
O2 d/c'd 

## 2012-02-03 ENCOUNTER — Inpatient Hospital Stay (HOSPITAL_COMMUNITY): Payer: BC Managed Care – PPO

## 2012-02-03 LAB — PHENYTOIN LEVEL, TOTAL: Phenytoin Lvl: 15.8 ug/mL (ref 10.0–20.0)

## 2012-02-03 MED ORDER — DEXAMETHASONE 2 MG PO TABS
2.0000 mg | ORAL_TABLET | Freq: Three times a day (TID) | ORAL | Status: DC
  Administered 2012-02-03 – 2012-02-04 (×3): 2 mg via ORAL
  Filled 2012-02-03 (×8): qty 1

## 2012-02-03 MED ORDER — PHENYTOIN 50 MG PO CHEW
100.0000 mg | CHEWABLE_TABLET | Freq: Three times a day (TID) | ORAL | Status: DC
  Administered 2012-02-03 – 2012-02-04 (×3): 100 mg via ORAL
  Filled 2012-02-03 (×7): qty 2

## 2012-02-03 MED ORDER — DEXAMETHASONE SODIUM PHOSPHATE 4 MG/ML IJ SOLN: 2.0000 mg | Freq: Three times a day (TID) | INTRAMUSCULAR | Status: DC

## 2012-02-03 NOTE — Progress Notes (Signed)
SLP has reviewed and agrees with student's note below.  Breck Coons [place].Ed [employer] [phone]  02/03/2012

## 2012-02-03 NOTE — Progress Notes (Signed)
Subjective: Patient reports speech better - no sz since new medicine added  Objective: Vital signs in last 24 hours: Temp:  [97.8 F (36.6 C)-98.9 F (37.2 C)] 98.4 F (36.9 C) (09/12 0700) Pulse Rate:  [61-96] 63  (09/12 0600) Resp:  [12-20] 13  (09/12 0600) BP: (90-140)/(42-81) 122/80 mmHg (09/12 0600) SpO2:  [94 %-100 %] 96 % (09/12 0600)  Intake/Output from previous day: 09/11 0701 - 09/12 0700 In: 1980 [P.O.:770; I.V.:1153; IV Piggyback:57] Out: 2 [Urine:1; Stool:1] Intake/Output this shift:    no change  Lab Results: No results found for this basename: WBC:2,HGB:2,HCT:2,PLT:2 in the last 72 hours BMET No results found for this basename: NA:2,K:2,CL:2,CO2:2,GLUCOSE:2,BUN:2,CREATININE:2,CALCIUM:2 in the last 72 hours  Studies/Results: Ct Head Wo Contrast  02/03/2012  *RADIOLOGY REPORT*  Clinical Data: Followup intracranial hemorrhage.  Question change?  CT HEAD WITHOUT CONTRAST  Technique:  Contiguous axial images were obtained from the base of the skull through the vertex without contrast.  Comparison: 01/31/2012.  Findings: Increase in size of the left frontal lobe lesion now measuring 2.8 x 2.8 cm versus prior 1.8 x 2.1 cm.  Anterior lateral margin with lobulated cystic component.  Surrounding vasogenic edema has progressed.  The cause of this finding is indeterminate. This does not represent a simple enlarging hematoma.  This may represent hemorrhage into an underlying mass. Underlying vascular malformation/aneurysm not excluded.  Please see catheter angiogram report (performed 02/02/2012 although report not available currently). The patient may eventually benefit from contrast enhanced MR.  No acute thrombotic infarct.  No hydrocephalus.  IMPRESSION: Increase in size of left frontal lobe lesion. This may represent hemorrhage into an underlying mass with increase in surrounding vasogenic edema.  Please see above.  This has been made a PRA call report utilizing dashboard call  feature.   Original Report Authenticated By: Fuller Canada, M.D.     Assessment/Plan: Angio negative -     Pt doing well - transfer to floor  LOS: 3 days     Avraj Lindroth R, MD 02/03/2012, 9:29 AM

## 2012-02-03 NOTE — Progress Notes (Signed)
Speech Language Pathology Dysphagia Treatment Patient Details Name: Duane Smith MRN: [id number] DOB: [birth year] Today's Date: 02/03/2012 Time: 0226-0254 SLP Time Calculation (min): 28 min  Assessment / Plan / Recommendation Clinical Impression  Pt. seen for dysphagia treatment focusing on facilitation of oral phase and strengthening of lingual/labial/facial muscles to derease right oral pocketing.  No s/s of aspiration observed at bedside with thin liquids or cracker. Anterior loss of bolus observed with cup sip of thin liquid; Pt. able to maintain proper labial seal with straw.  Pt. demonstrated labial/lingual strengthening exercises with minmal verbal/visual cues provided by SLP.  Observed new right lingual deviation indicative of lingual weakness. With min verbal cueing, Pt. is able to move tongue to medial oral cavity. Pt. utilized his camera phone to identify his motor deficits with min verbal cueing.     Diet Recommendation  Continue with Current Diet: Regular;Thin liquid    SLP Plan Continue with current plan of care      Swallowing Goals  SLP Swallowing Goals Patient will consume recommended diet without observed clinical signs of aspiration with: Independent assistance Swallow Study Goal #1 - Progress: Progressing toward goal Patient will utilize recommended strategies during swallow to increase swallowing safety with: Independent assistance Swallow Study Goal #2 - Progress: Progressing toward goal  General Temperature Spikes Noted: No Respiratory Status: Room air Behavior/Cognition: Alert;Cooperative;Pleasant mood Oral Cavity - Dentition: Adequate natural dentition  Oral Cavity - Oral Hygiene Does patient have any of the following "at risk" factors?: None of the above Brush patient's teeth BID with toothbrush (using toothpaste with fluoride): Yes Patient is HIGH RISK - Oral Care Protocol followed (see row info): Yes Patient is AT RISK - Oral Care Protocol followed (see row  info): Yes   Dysphagia Treatment Treatment focused on: Facilitation of oral phase;Skilled observation of diet tolerance;Patient/family/caregiver education;Facilitation of oral preparatory phase Treatment Methods/Modalities: Skilled observation (oral motor exercises) Patient observed directly with PO's: Yes Type of PO's observed: Regular;Thin liquids Feeding: Able to feed self Liquids provided via: Cup;Straw Oral Phase Signs & Symptoms: Anterior loss/spillage Type of cueing: Verbal Amount of cueing: Minimal   GO     Duane Smith 02/03/2012, 3:38 PM

## 2012-02-03 NOTE — Progress Notes (Signed)
Stroke Team Progress Note  HISTORY Duane Smith is an 36 y.o. male who 2 days ago began having intermittent episodes of aphasia. He states that he'll be in his normal speech pattern, then suddenly will have 30-40 seconds of being unable to say what he wants to say. Yesterday, he began having a right facial droop as well.  He presented to the ER at [facility] [place] in there he had a CT which showed a relatively superficial bleed in the left posterior frontal lobe. He was transferred to [facility], and admitted to neurosurgery given concern for hemorrhagic tumor. He received an MRI MRA which did not show a clear etiology for his hemorrhage. He continues to have intermittent episodes, states that he had 4 today.  Patient was not a TPA candidate secondary to hemorrhage. He was admitted for further evaluation and treatment.  SUBJECTIVE 2 episodes of seizure after rounds yesterday. Vimpat added. Family at bedside.Denies headache or new symptoms. Rt face weakness persists. CT shows slight increase in hematoma size and edema but no clinical worsening.Cerebral angio no AVM  OBJECTIVE Most recent Vital Signs: Filed Vitals:   02/03/12 0200 02/03/12 0300 02/03/12 0400 02/03/12 0600  BP: 96/50 95/51 90/42  122/80  Pulse: 66 67 65 63  Temp:   97.8 F (36.6 C)   TempSrc:   Oral   Resp: 16 17 19 13   Height:      Weight:      SpO2: 95% 95% 96% 96%   Intake/Output from previous day: 09/11 0701 - 09/12 0700 In: 1980 [P.O.:770; I.V.:1153; IV Piggyback:57] Out: 2 [Urine:1; Stool:1]  IV Fluid Intake:     . sodium chloride 50 mL/hr (02/03/12 0421)  . sodium chloride     MEDICATIONS    . Chlorhexidine Gluconate Cloth  6 each Topical Q0600  . dexamethasone  2 mg Intravenous Q6H  . lacosamide (VIMPAT) IV  300 mg Intravenous Once  . lacosamide  100 mg Oral Once  . lacosamide  50 mg Oral BID  . levETIRAcetam  2,000 mg Oral Q12H  . mupirocin ointment  1 application Nasal BID  . pantoprazole  40  mg Oral QHS  . phenytoin (DILANTIN) IV  100 mg Intravenous Q8H  . senna-docusate  1 tablet Oral BID  . sodium chloride  3 mL Intravenous Q12H  . DISCONTD: levETIRAcetam  1,500 mg Oral Q12H   PRN:  acetaminophen, acetaminophen, fentaNYL, iohexol, labetalol, midazolam, ondansetron (ZOFRAN) IV  Diet:  General thin liquids Activity:  Ambulate patient  DVT Prophylaxis:  SCDs  CLINICALLY SIGNIFICANT STUDIES Basic Metabolic Panel:   Lab 01/31/12 0213 01/31/12 0055  NA 140 140  K 4.0 3.5  CL 104 102  CO2 26 25  GLUCOSE 102* 112*  BUN 16 16  CREATININE 1.12 1.20  CALCIUM 9.1 9.4  MG -- --  PHOS -- --   Liver Function Tests:   Lab 01/31/12 0213 01/31/12 0055  AST 32 27  ALT 23 21  ALKPHOS 50 58  BILITOT 0.2* 0.2*  PROT 7.1 7.5  ALBUMIN 3.7 4.0   CBC:   Lab 01/31/12 0055  WBC 9.9  NEUTROABS 5.8  HGB 14.4  HCT 41.4  MCV 88.3  PLT 247   Coagulation:   Lab 01/31/12 0055  LABPROT 12.4  INR 0.91   Cardiac Enzymes:   Lab 01/31/12 0055  CKTOTAL 499*  CKMB 2.9  CKMBINDEX --  TROPONINI <0.30   Urine Drug Screen:     Component Value Date/Time  LABOPIA NONE DETECTED 01/31/2012 0755   COCAINSCRNUR NONE DETECTED 01/31/2012 0755   LABBENZ NONE DETECTED 01/31/2012 0755   AMPHETMU NONE DETECTED 01/31/2012 0755   THCU NONE DETECTED 01/31/2012 0755   LABBARB NONE DETECTED 01/31/2012 0755    Dilantin:  02/03/2012 21.3  CT of the brain  01/31/2012 1. 2.1 cm rounded hemorrhagic and cystic mass in the posterior left frontal lobe. 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 Further evaluation with pre and postcontrast magnetic resonance imaging is recommended.  2. 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  MRI of the brain  01/31/2012   1.  2.7 cm posterior left frontal lobe hemorrhage without any definitive underlying mass. Follow-up MRI of the brain without and with  contrast at to 3 months may be useful in evaluating for nerve lining mass after partial resolution of the blood products. 2.  Focal mass effect and 1-2 millimeters of midline shift.  MRA of the brain  01/31/2012   Normal variant MRA circle of Willis without evidence for significant proximal stenosis, aneurysm, or branch vessel occlusion.    Cerebral angio brain 1.2.9cm x 2.7 cm Lt parietal frontal avascular mass . With displacement of posterior Vessels. 2. No aneurysms,AVM or DAVF or dissections seen. Venous outflow WNLs   CXR  01/31/2012  No evidence of active pulmonary disease.  EKG  sinus tachycardia.   EEG Normal waking and sleep EEG.   Therapy Recommendations PT - none; OT - none; ST - none  Physical Exam   Pleasant young healthy african [place] male not in distress.Awake alert. Afebrile. Head is nontraumatic. Neck is supple without bruit. Hearing is normal. Cardiac exam no murmur or gallop. Lungs are clear to auscultation. Distal pulses are well felt.  Neurological exam :   Awake  Alert oriented x 3. Normal speech and languageAble to name, repeat and comprehend well. Fluent speech.Fundi not visualized. Visual fields and acuity appear normal..eye movements full without nystagmus. Face asymmetric with moderate right lower face weak.. Tongue midline. Normal strength, tone, reflexes and coordination. Normal sensation. Gait deferred.   ASSESSMENT Mr. Duane Smith is a 36 y.o. male presenting with worsening aphasia secondary to repititive simple partial seizures. Imaging confirms a left frontal hemorrhagic lesion with mixed old cystic and fresh hemorrhagic components and minimal edema. Angio negative for AVM,vascular tumor. Unsure what lesion is.   Seizures, on keppra, dilantin and vimpat. Seizures difficult to control, making long-term prognosis poor for seizure resolving.  Hospital day # 3  TREATMENT/PLAN  Continue three antiepileptics, will change everything to PO  Transfer to the floor  from neuro standpoint Anticipate discharge in am No driving at discharge Follow up with Dr. Pearlean Brownie in 2 mos  Dr. Pearlean Brownie discussed diagnosis, prognosis and plan of care with Dr. Phoebe Perch.  Annie Main, MSN, RN, ANVP-BC, ANP-BC, [facility] Pager: [phone] 02/03/2012 8:07 AM  Scribe for Dr. Delia Heady, Stroke Center Medical Director, who has personally reviewed chart, pertinent data, examined the patient and developed the plan of care. Pager:  [phone]

## 2012-02-04 MED ORDER — LACOSAMIDE 50 MG PO TABS: 50.0000 mg | ORAL_TABLET | Freq: Two times a day (BID) | ORAL | Status: DC

## 2012-02-04 MED ORDER — PHENYTOIN 50 MG PO CHEW: 100.0000 mg | CHEWABLE_TABLET | Freq: Three times a day (TID) | ORAL | Status: DC

## 2012-02-04 MED ORDER — DEXAMETHASONE 2 MG PO TABS: 2.0000 mg | ORAL_TABLET | Freq: Two times a day (BID) | ORAL | Status: AC

## 2012-02-04 MED ORDER — LEVETIRACETAM 1000 MG PO TABS: 2000.0000 mg | ORAL_TABLET | Freq: Two times a day (BID) | ORAL | Status: DC

## 2012-02-04 NOTE — Progress Notes (Signed)
Stroke Team Progress Note  HISTORY Duane Smith is an 36 y.o. male who 2 days ago began having intermittent episodes of aphasia. He states that he'll be in his normal speech pattern, then suddenly will have 30-40 seconds of being unable to say what he wants to say. Yesterday, he began having a right facial droop as well.  He presented to the ER at [facility] [place] in there he had a CT which showed a relatively superficial bleed in the left posterior frontal lobe. He was transferred to [facility], and admitted to neurosurgery given concern for hemorrhagic tumor. He received an MRI MRA which did not show a clear etiology for his hemorrhage. He continues to have intermittent episodes, states that he had 4 today.  Patient was not a TPA candidate secondary to hemorrhage. He was admitted for further evaluation and treatment.  SUBJECTIVE No further seizure. Patient already discharged.  OBJECTIVE Most recent Vital Signs: Filed Vitals:   02/03/12 1940 02/03/12 2000 02/03/12 2100 02/04/12 0616  BP:  115/60 134/67 139/64  Pulse:  71 83 86  Temp: 98.4 F (36.9 C)  98 F (36.7 C) 98 F (36.7 C)  TempSrc: Oral  Oral Oral  Resp:  19 18 18   Height:   5\' 9"  (1.753 m)   Weight:   111.131 kg (245 lb)   SpO2:  95% 99% 100%   IV Fluid Intake:     . DISCONTD: sodium chloride 50 mL/hr (02/03/12 0421)   MEDICATIONS    . Chlorhexidine Gluconate Cloth  6 each Topical Q0600  . dexamethasone  2 mg Oral Q8H  . lacosamide  50 mg Oral BID  . levETIRAcetam  2,000 mg Oral Q12H  . mupirocin ointment  1 application Nasal BID  . pantoprazole  40 mg Oral QHS  . phenytoin  100 mg Oral TID  . senna-docusate  1 tablet Oral BID  . sodium chloride  3 mL Intravenous Q12H  . DISCONTD: dexamethasone  2 mg Intravenous Q6H  . DISCONTD: dexamethasone  2 mg Intravenous Q8H   PRN:  acetaminophen, acetaminophen, fentaNYL, labetalol, midazolam, ondansetron (ZOFRAN) IV  Diet:  General thin liquids Activity:   Ambulate patient  DVT Prophylaxis:  SCDs  CLINICALLY SIGNIFICANT STUDIES Basic Metabolic Panel:   Lab 01/31/12 0213 01/31/12 0055  NA 140 140  K 4.0 3.5  CL 104 102  CO2 26 25  GLUCOSE 102* 112*  BUN 16 16  CREATININE 1.12 1.20  CALCIUM 9.1 9.4  MG -- --  PHOS -- --   Liver Function Tests:   Lab 01/31/12 0213 01/31/12 0055  AST 32 27  ALT 23 21  ALKPHOS 50 58  BILITOT 0.2* 0.2*  PROT 7.1 7.5  ALBUMIN 3.7 4.0   CBC:   Lab 01/31/12 0055  WBC 9.9  NEUTROABS 5.8  HGB 14.4  HCT 41.4  MCV 88.3  PLT 247   Coagulation:   Lab 01/31/12 0055  LABPROT 12.4  INR 0.91   Cardiac Enzymes:   Lab 01/31/12 0055  CKTOTAL 499*  CKMB 2.9  CKMBINDEX --  TROPONINI <0.30   Urine Drug Screen:     Component Value Date/Time   LABOPIA NONE DETECTED 01/31/2012 0755   COCAINSCRNUR NONE DETECTED 01/31/2012 0755   LABBENZ NONE DETECTED 01/31/2012 0755   AMPHETMU NONE DETECTED 01/31/2012 0755   THCU NONE DETECTED 01/31/2012 0755   LABBARB NONE DETECTED 01/31/2012 0755    Dilantin:  02/03/2012 21.3  CT of the brain  01/31/2012  1. 2.1 cm rounded hemorrhagic and cystic mass in the posterior left frontal lobe. 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 Further evaluation with pre and postcontrast magnetic resonance imaging is recommended.  2. 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  MRI of the brain  01/31/2012   1.  2.7 cm posterior left frontal lobe hemorrhage without any definitive underlying mass. Follow-up MRI of the brain without and with contrast at to 3 months may be useful in evaluating for nerve lining mass after partial resolution of the blood products. 2.  Focal mass effect and 1-2 millimeters of midline shift.  MRA of the brain  01/31/2012   Normal variant MRA circle of Willis without evidence for significant proximal stenosis, aneurysm, or branch vessel occlusion.     Cerebral angio brain 1.2.9cm x 2.7 cm Lt parietal frontal avascular mass . With displacement of posterior Vessels. 2. No aneurysms,AVM or DAVF or dissections seen. Venous outflow WNLs   CXR  01/31/2012  No evidence of active pulmonary disease.  EKG  sinus tachycardia.   EEG Normal waking and sleep EEG.   Therapy Recommendations PT - none; OT - none; ST - none  Physical Exam   Pleasant young healthy african [place] male not in distress.Awake alert. Afebrile. Head is nontraumatic. Neck is supple without bruit. Hearing is normal. Cardiac exam no murmur or gallop. Lungs are clear to auscultation. Distal pulses are well felt.  Neurological exam :   Awake  Alert oriented x 3. Normal speech and languageAble to name, repeat and comprehend well. Fluent speech.Fundi not visualized. Visual fields and acuity appear normal..eye movements full without nystagmus. Face asymmetric with moderate right lower face weak.. Tongue midline. Normal strength, tone, reflexes and coordination. Normal sensation. Gait deferred.   ASSESSMENT Duane Smith is a 36 y.o. male presenting with worsening aphasia secondary to repititive simple partial seizures. Imaging confirms a left frontal hemorrhagic lesion with mixed old cystic and fresh hemorrhagic components and minimal edema. Angio negative for AVM,vascular tumor. Unsure what lesion is.   Seizures, on keppra, dilantin and vimpat. Seizures difficult to control, making long-term prognosis poor for seizure resolving.  Hospital day # 4  TREATMENT/PLAN  Continue three antiepileptics Agree with plans for discharge  No driving at discharge Follow up with Dr. Pearlean Brownie in 2 mos  Annie Main, MSN, RN, ANVP-BC, ANP-BC, GNP-BC [facility] Pager: [phone] 02/04/2012 9:44 AM  Scribe for Dr. Delia Heady, Stroke Center Medical Director, who has personally reviewed chart, pertinent data, examined the patient and developed the plan of care. Pager:  [phone]

## 2012-02-04 NOTE — Care Management Note (Signed)
    Page 1 of 1   02/04/2012     12:11:21 PM   CARE MANAGEMENT NOTE 02/04/2012  Patient:  Duane Smith,Duane Smith   Account Number:  [id number]  Date Initiated:  02/01/2012  Documentation initiated by:  [facility]  Subjective/Objective Assessment:   Admitted with left frontal ICH,intracranial mass     Action/Plan:   PT eval- no d/c needs   Anticipated DC Date:  02/04/2012   Anticipated DC Plan:  HOME/SELF CARE      DC Planning Services  CM consult      Choice offered to / List presented to:             Status of service:  Completed, signed off Medicare Important Message given?   (If response is "NO", the following Medicare IM given date fields will be blank) Date Medicare IM given:   Date Additional Medicare IM given:    Discharge Disposition:  HOME/SELF CARE  Per UR Regulation:  Reviewed for med. necessity/level of care/duration of stay  If discussed at Long Length of Stay Meetings, dates discussed:    Comments:

## 2012-02-04 NOTE — Discharge Summary (Signed)
Physician Discharge Summary  Patient ID: Duane Smith MRN: [id number] DOB/AGE: Oct 05, 1975 36 y.o.  Admit date: 01/31/2012 Discharge date: 02/04/2012  Admission Diagnoses:ICH, SZ  Discharge Diagnoses: same Active Problems:  * No active hospital problems. *    Discharged Condition: fair  Hospital Course: pt admitted with Right facial weakness, sz - had Left frontal ICH - admitted to ICU - had focal sz first couple days - noe controlled with meds - w/u with MRI, MRA, angiogram  All negative for underlying lesion - pt worked with speech tx - has made progress  Consults: neurology  Significant Diagnostic Studies: angiography: brain ,   MRI brain , CT brain  Treatments:low dose  Steroids , sz meds, obsservation  Discharge Exam: Blood pressure 139/64, pulse 86, temperature 98 F (36.7 C), temperature source Oral, resp. rate 18, height 5\' 9"  (1.753 m), weight 111.131 kg (245 lb), SpO2 100.00%. Right facial weakness with some slurred speech -   O/w motor intact  Disposition: home     Medication List     As of 02/04/2012  8:54 AM    TAKE these medications         dexamethasone 2 MG tablet   Commonly known as: DECADRON   Take 1 tablet (2 mg total) by mouth every 12 (twelve) hours.      lacosamide 50 MG Tabs   Commonly known as: VIMPAT   Take 1 tablet (50 mg total) by mouth 2 (two) times daily.      levETIRAcetam 1000 MG tablet   Commonly known as: KEPPRA   Take 2 tablets (2,000 mg total) by mouth every 12 (twelve) hours.      phenytoin 50 MG tablet   Commonly known as: DILANTIN   Chew 2 tablets (100 mg total) by mouth 3 (three) times daily.         SignedClydene Fake, MD 02/04/2012, 8:54 AM

## 2012-02-26 ENCOUNTER — Emergency Department (HOSPITAL_BASED_OUTPATIENT_CLINIC_OR_DEPARTMENT_OTHER)
Admission: EM | Admit: 2012-02-26 | Discharge: 2012-02-26 | Disposition: A | Discharge status: Home / Self Care | Payer: BC Managed Care – PPO | Attending: Emergency Medicine | Admitting: Emergency Medicine

## 2012-02-26 ENCOUNTER — Encounter (HOSPITAL_BASED_OUTPATIENT_CLINIC_OR_DEPARTMENT_OTHER): Payer: Self-pay | Admitting: Emergency Medicine

## 2012-02-26 DIAGNOSIS — T7840XA Allergy, unspecified, initial encounter: Secondary | ICD-10-CM

## 2012-02-26 DIAGNOSIS — R21 Rash and other nonspecific skin eruption: Secondary | ICD-10-CM | POA: Insufficient documentation

## 2012-02-26 DIAGNOSIS — G40909 Epilepsy, unspecified, not intractable, without status epilepticus: Secondary | ICD-10-CM | POA: Insufficient documentation

## 2012-02-26 DIAGNOSIS — Z8673 Personal history of transient ischemic attack (TIA), and cerebral infarction without residual deficits: Secondary | ICD-10-CM | POA: Insufficient documentation

## 2012-02-26 DIAGNOSIS — Z79899 Other long term (current) drug therapy: Secondary | ICD-10-CM | POA: Insufficient documentation

## 2012-02-26 DIAGNOSIS — F17200 Nicotine dependence, unspecified, uncomplicated: Secondary | ICD-10-CM | POA: Insufficient documentation

## 2012-02-26 HISTORY — DX: Unspecified convulsions: R56.9

## 2012-02-26 HISTORY — DX: Cerebral infarction, unspecified: I63.9

## 2012-02-26 MED ORDER — PREDNISONE 50 MG PO TABS: ORAL_TABLET | ORAL | Status: DC

## 2012-02-26 MED ORDER — METHYLPREDNISOLONE SODIUM SUCC 125 MG IJ SOLR
125.0000 mg | Freq: Once | INTRAMUSCULAR | Status: AC
  Administered 2012-02-26: 125 mg via INTRAVENOUS
  Filled 2012-02-26: qty 2

## 2012-02-26 MED ORDER — FAMOTIDINE 20 MG PO TABS: 20.0000 mg | ORAL_TABLET | Freq: Two times a day (BID) | ORAL | Status: DC

## 2012-02-26 MED ORDER — LORAZEPAM 2 MG/ML IJ SOLN
1.0000 mg | Freq: Once | INTRAMUSCULAR | Status: AC
  Administered 2012-02-26: 1 mg via INTRAVENOUS
  Filled 2012-02-26: qty 1

## 2012-02-26 MED ORDER — FAMOTIDINE IN NACL 20-0.9 MG/50ML-% IV SOLN
20.0000 mg | Freq: Once | INTRAVENOUS | Status: AC
  Administered 2012-02-26: 20 mg via INTRAVENOUS
  Filled 2012-02-26: qty 50

## 2012-02-26 MED ORDER — DIPHENHYDRAMINE HCL 50 MG/ML IJ SOLN
25.0000 mg | Freq: Once | INTRAMUSCULAR | Status: AC
  Administered 2012-02-26: 25 mg via INTRAVENOUS
  Filled 2012-02-26: qty 1

## 2012-02-26 NOTE — ED Provider Notes (Signed)
History     CSN: [id number]  Arrival date & time 02/26/12  0346   First MD Initiated Contact with Patient 02/26/12 0359      Chief Complaint  Patient presents with  . Allergic Reaction    (Consider location/radiation/quality/duration/timing/severity/associated sxs/prior treatment) Patient is a 36 y.o. male presenting with allergic reaction. The history is provided by the patient. No language interpreter was used.  Allergic Reaction The primary symptoms are  rash. The primary symptoms do not include wheezing, shortness of breath, nausea, vomiting or diarrhea. The current episode started more than 2 days ago. The problem has been gradually worsening. This is a new problem.  The rash began 2 to 7 days ago. Location: entire body. The rash is associated with itching. Risk factors for rash include new medications.  The onset of the reaction was associated with a new medication. Significant symptoms also include itching.    Past Medical History  Diagnosis Date  . Seizures   . Stroke     Past Surgical History  Procedure Date  . Rotator cuff repair     No family history on file.  History  Substance Use Topics  . Smoking status: Current Every Day Smoker  . Smokeless tobacco: Not on file  . Alcohol Use: Yes     occ      Review of Systems  Respiratory: Negative for shortness of breath and wheezing.   Gastrointestinal: Negative for nausea, vomiting and diarrhea.  Skin: Positive for itching and rash.  All other systems reviewed and are negative.    Allergies  Review of patient's allergies indicates no known allergies.  Home Medications   Current Outpatient Rx  Name Route Sig Dispense Refill  . LACOSAMIDE 50 MG PO TABS Oral Take 1 tablet (50 mg total) by mouth 2 (two) times daily. 60 tablet 4  . LEVETIRACETAM 1000 MG PO TABS Oral Take 2 tablets (2,000 mg total) by mouth every 12 (twelve) hours. 120 tablet 4  . PHENYTOIN 50 MG PO CHEW Oral Chew 2 tablets (100 mg total)  by mouth 3 (three) times daily. 180 tablet 4    BP 129/79  Pulse 94  Temp 98.6 F (37 C) (Oral)  Resp 18  SpO2 97%  Physical Exam  Constitutional: He is oriented to person, place, and time. He appears well-developed and well-nourished. No distress.  HENT:  Head: Normocephalic and atraumatic.  Mouth/Throat: No oropharyngeal exudate.       No swelling of the lips tongue or uvula  Eyes: Conjunctivae normal are normal. Pupils are equal, round, and reactive to light.  Neck: Normal range of motion. Neck supple.  Cardiovascular: Normal rate and regular rhythm.   Pulmonary/Chest: Effort normal. No stridor. He has no wheezes. He has no rales.  Abdominal: Soft. Bowel sounds are normal. There is no tenderness. There is no rebound and no guarding.  Musculoskeletal: He exhibits edema.  Neurological: He is alert and oriented to person, place, and time.  Skin: Skin is warm and dry.  Psychiatric: He has a normal mood and affect.    ED Course  Procedures (including critical care time)  Labs Reviewed - No data to display No results found.   No diagnosis found.    MDM  Need to discuss with neurology changing antiepileptics.  Patient and wife verbalize understanding and agree to follow up.  Return for worsening symptoms        Lakina Mcintire K Sussie Minor-Rasch, MD 02/26/12 [phone]

## 2012-02-26 NOTE — ED Notes (Signed)
Pt c/o itching all over, onset intermittent x 1 week ago. Pt recently started new medication sept 9th.

## 2012-02-26 NOTE — ED Notes (Signed)
Pt reports itching has significantly worsened tonight

## 2012-09-13 NOTE — Progress Notes (Signed)
SLP has reviewed and agrees with student's note below.  Breck Coons [place].Ed [employer] [phone]  09/13/2012

## 2012-11-16 ENCOUNTER — Encounter: Payer: Self-pay | Admitting: Neurology

## 2012-11-16 DIAGNOSIS — G40909 Epilepsy, unspecified, not intractable, without status epilepticus: Secondary | ICD-10-CM

## 2012-11-16 DIAGNOSIS — I619 Nontraumatic intracerebral hemorrhage, unspecified: Secondary | ICD-10-CM

## 2012-11-29 ENCOUNTER — Encounter: Payer: Self-pay | Admitting: Neurology

## 2012-12-22 NOTE — Progress Notes (Signed)
This encounter was created in error - please disregard.

## 2013-01-20 ENCOUNTER — Telehealth: Payer: Self-pay | Admitting: Neurology

## 2013-01-20 NOTE — Telephone Encounter (Signed)
The patient is traveling, and he forgot the keppra medication. I called in a Rx for keppra 1000 mg bid.

## 2013-01-23 ENCOUNTER — Telehealth: Payer: Self-pay | Admitting: Neurology

## 2013-01-24 ENCOUNTER — Telehealth: Payer: Self-pay

## 2013-01-24 NOTE — Telephone Encounter (Signed)
I called Duane Smith's wife. He had taken his medication regularly. Usually he has facial spasms. This time (yesterday), Duane Smith had grand mal seizure with full body rigidity  teeth clenching, drooling, no void or bowel loss. Lasted about 3 minutes.  Does Dr. Pearlean Brownie want Duane Smith to have blood work done, medication change, office visit?

## 2013-01-24 NOTE — Telephone Encounter (Signed)
Consult with Dr. Pearlean Brownie. Continue Keppra at present does. If patient has another seizure, we will increase keppra. We will schedule an appointment with Heide Guile, NP for follow up, since patient missed July appointment.   I called patient's wife to update on plan.

## 2013-02-08 ENCOUNTER — Ambulatory Visit: Payer: Self-pay | Admitting: Nurse Practitioner

## 2013-02-14 ENCOUNTER — Ambulatory Visit (INDEPENDENT_AMBULATORY_CARE_PROVIDER_SITE_OTHER): Payer: BC Managed Care – PPO | Admitting: Nurse Practitioner

## 2013-02-14 ENCOUNTER — Encounter: Payer: Self-pay | Admitting: Nurse Practitioner

## 2013-02-14 VITALS — BP 127/80 | HR 80 | Temp 97.6°F | Ht 69.0 in | Wt 262.0 lb

## 2013-02-14 DIAGNOSIS — G40909 Epilepsy, unspecified, not intractable, without status epilepticus: Secondary | ICD-10-CM

## 2013-02-14 DIAGNOSIS — I619 Nontraumatic intracerebral hemorrhage, unspecified: Secondary | ICD-10-CM

## 2013-02-14 MED ORDER — LEVETIRACETAM 1000 MG PO TABS: 2000.0000 mg | ORAL_TABLET | Freq: Two times a day (BID) | ORAL | Status: DC

## 2013-02-14 NOTE — Progress Notes (Signed)
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   MRI of brain was suspicious of underlying mass; we will repeat MRI in November to follow up.  No vascular risk factors.    06/01/12 (PS) He returns for followup after last visit on 03/01/12. He has successfully tapered and stopped Vimpat  and has not had any breakthrough seizures. He is tolerating Keppra without any side effects. He had EEG done on 03/02/12 which was normal. MRI scan of the brain done on 03/28/12 showed stable size of the subacute hematoma witthout any underlying mass. He has returned back to work full-time without restrictions. He has no new complaints today.  UPDATE 02/14/13 (LL):  Patient comes in for follow up, last office visit was on 06/01/12.  Patient had 2 seizures since being here, was off medicine for 4 days and had a mild seizure.  Then on 01/22/13 he had missed a dose and had a worse seizure in which he felt like he couldn't breathe, his lip trembled, clenched his fists, and blacked out for a minute or two.  He was out of town and had missed a dose of his medication, plus doing yard work and was hot and likely dehydrated.  He is very careful to take his Keppra every 12 hours now.  Last MRI on 06/16/12 showed expected evolutionary changes of ICH.    Review of Systems  Out of a complete 14 system review, the patient complains of only the following symptoms, and all other reviewed systems are negative. Neurologic: seizure    Psychiatric: Anxiety   ALLERGIES: Allergies  Allergen Reactions  . Dilantin [Phenytoin Sodium Extended] Itching and Rash    HOME MEDICATIONS: Outpatient Prescriptions Prior to Visit    Medication Sig Dispense Refill  . levETIRAcetam (KEPPRA) 1000 MG tablet Take 2 tablets (2,000 mg total) by mouth every 12 (twelve) hours.  120 tablet  4  . famotidine (PEPCID) 20 MG tablet Take 1 tablet (20 mg total) by mouth 2 (two) times daily.  10 tablet  0  . lacosamide (VIMPAT) 50 MG TABS Take 1 tablet (50 mg total) by mouth 2 (two) times daily.  60 tablet  4  . phenytoin (DILANTIN) 50 MG tablet Chew 2 tablets (100 mg total) by mouth 3 (three) times daily.  180 tablet  4  . predniSONE (DELTASONE) 50 MG tablet 1 tablet PO qam x 5 days  5 tablet  0   No facility-administered medications prior to visit.    PAST MEDICAL HISTORY: Past Medical History  Diagnosis Date  . Seizures   . Stroke     PAST SURGICAL HISTORY: Past Surgical History  Procedure Laterality Date  . Rotator cuff repair      FAMILY HISTORY: Family History  Problem Relation Age of Onset  . Breast cancer Mother   . High blood pressure Father   . Prostate cancer Father     SOCIAL HISTORY: History   Social History  . Marital Status: Married    Spouse Name: N/A    Number of Children: N/A  . Years of Education: N/A   Occupational History  . Not on file.   Social History Main  Topics  . Smoking status: Former Smoker    Types: Cigars    Quit date: 06/16/2012  . Smokeless tobacco: Not on file  . Alcohol Use: Yes     Comment: occ  . Drug Use: No  . Sexual Activity: Not on file   Other Topics Concern  . Not on file   Social History Narrative   Right-handed 37 year old [place] American male with a college education lives at home with his wife and 2 daughters; he works full-time at [employer].   The pt denies the use of recreational drugs or caffeine; he used tobacco and alcohol socially but stopped on 01-31-12.     PHYSICAL EXAM  Filed Vitals:   02/14/13 1525  BP: 127/80  Pulse: 80  Temp: 97.6 F (36.4 C)  TempSrc: Oral  Height: 5\' 9"  (1.753 m)  Weight: 262 lb (118.842 kg)   Body  mass index is 38.67 kg/(m^2).  Physical Exam  General: Pleasant, in no distress.  Afebrile.   Head: nontraumatic Ears, Nose and Throat: Hearing is normal.  Neck: supple without bruit Respiratory: clear to auscultation Cardiovascular: no murmur or gallop Musculoskeletal: mild obesity Skin: no rash  Neurologic Exam  Mental Status: Awake, alert and oriented to time, place and person.  Speech and language appear normal.   Cranial Nerves: Eye movements are full range without nystagmus.  Fundi  not visualized.  Visual fields are full to confrontational testing.  Face is asymmetric on right.  Tongue is midline. Hearing is normal. Motor: reveals no upper or lower extremity drift.  Symmetric and equal strength in all four extremities.  No focal weakness. Sensory: Touch and temperature sensations are normal.   Coordination: normal finger-nose-finger and heel-shin. Gait and Station: steady gait including tandem walking, tiptoe and heel walking. Romberg negative. Reflexes: Deep tendon reflexes are 2+ symmetric.     DIAGNOSTIC DATA (LABS, IMAGING, TESTING) - I reviewed patient records, labs, notes, testing and imaging myself where available.  Lab Results  Component Value Date   WBC 9.9 01/31/2012   HGB 14.4 01/31/2012   HCT 41.4 01/31/2012   MCV 88.3 01/31/2012   PLT 247 01/31/2012      Component Value Date/Time   NA 140 01/31/2012 0213   K 4.0 01/31/2012 0213   CL 104 01/31/2012 0213   CO2 26 01/31/2012 0213   GLUCOSE 102* 01/31/2012 0213   BUN 16 01/31/2012 0213   CREATININE 1.12 01/31/2012 0213   CALCIUM 9.1 01/31/2012 0213   PROT 7.1 01/31/2012 0213   ALBUMIN 3.7 01/31/2012 0213   AST 32 01/31/2012 0213   ALT 23 01/31/2012 0213   ALKPHOS 50 01/31/2012 0213   BILITOT 0.2* 01/31/2012 0213   GFRNONAA 83* 01/31/2012 0213   GFRAA >90 01/31/2012 0213    MRI BRAIN WITHOUT  06/16/12  This MRI scan of the brain  shows remote age resolving hematoma in the left posterior frontal region with significant decrease in size and  expected evolutionary changes compared with previous MRI scan dated 03/28/12.  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 No vascular risk factors. 2 breakthrough seizures in setting of missed doses and dehydration.   Plan:  Continue Keppra at current dose. [facility] to [facility].  Discussed avoiding alcohol, skipped doses, lack of sleep.   Reinforced that [employer] states that he may not drive within 6 months of having a seizure. He  verbalizes an understanding of all instructions.    Meds ordered this encounter  Medications  . DISCONTD: levETIRAcetam (KEPPRA) 1000 MG tablet    Sig: Take 2,000 mg by mouth every 12 (twelve) hours.  . levETIRAcetam (KEPPRA) 1000 MG tablet    Sig: Take 2 tablets (2,000 mg total) by mouth every 12 (twelve) hours.    Dispense:  360 tablet    Refill:  3    Order Specific Question:  Supervising Provider    Answer:  Patterson Hammersmith Lawarence Meek, MSN, NP-C 02/14/2013, 4:16 PM [facility] [address], [address] [place], [place] [address] [phone]

## 2013-02-14 NOTE — Patient Instructions (Addendum)
Continue Keppra at current dose.  If you have a seizure, call the office.  State Law states that you may not drive within 6 months of having a seizure.  Follow up in 6 months.

## 2013-07-06 IMAGING — XA IR ANGIO INTRA EXTRACRAN SEL COM CAROTID INNOMINATE BILAT MOD SE
1 series · 13 of 24 positions shown · IV contrast (IODINE)
Comparison: CT scan of the brain of 01/31/2012, and MRI/MRA of the
brain of 01/31/2012.

CLINICAL DATA: History of expressive aphasia.  Right hand
weakness.  CT of the brain revealing left posterior frontal
hemorrhage.

BILATERAL COMMON CAROTID ARTERY AND BILATERAL VERTEBRAL ARTERY
ANGIOGRAMS

[Series 300: ir angio intra extracran sel internal ca · 13 of 184 slices shown]
[im 1/184]
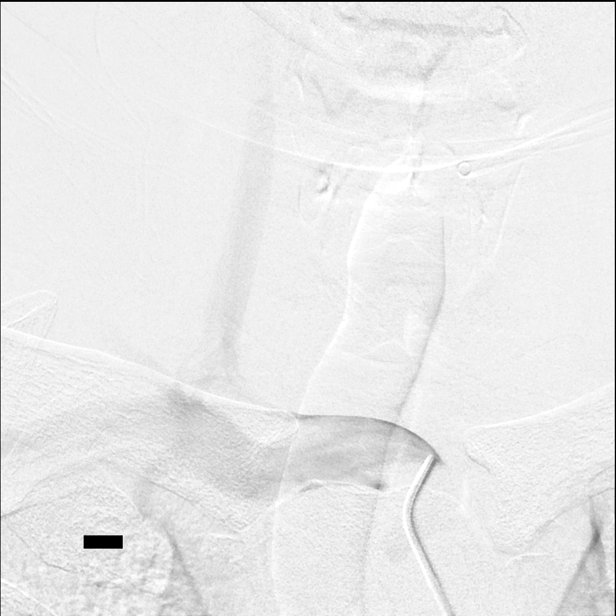
[im 16/184]
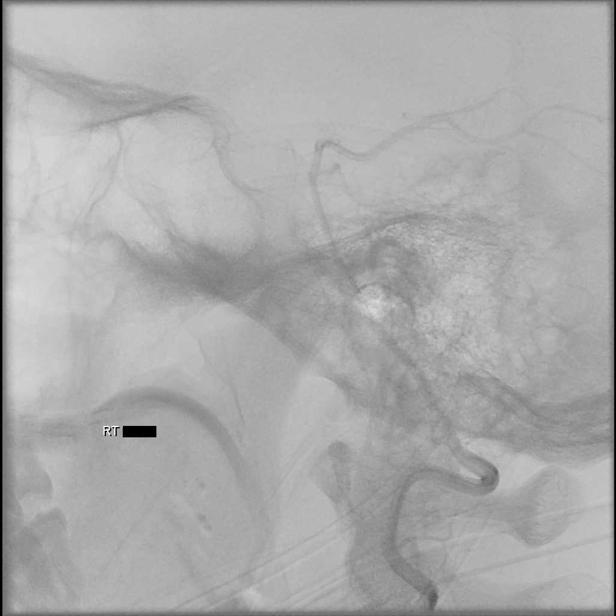
[im 32/184]
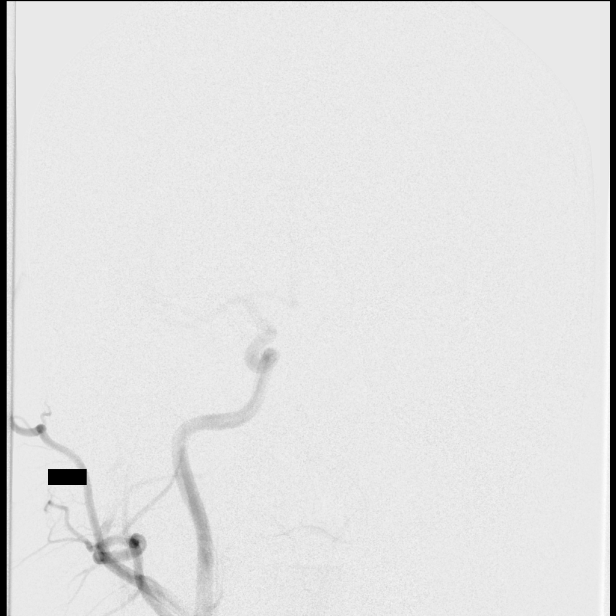
[im 48/184]
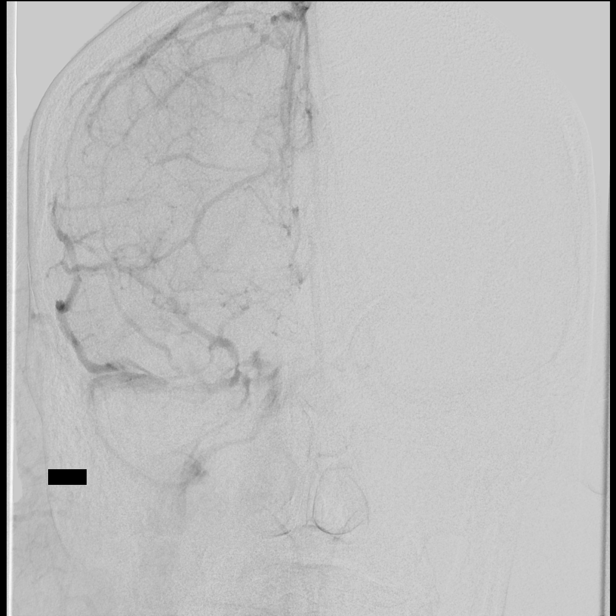
[im 64/184]
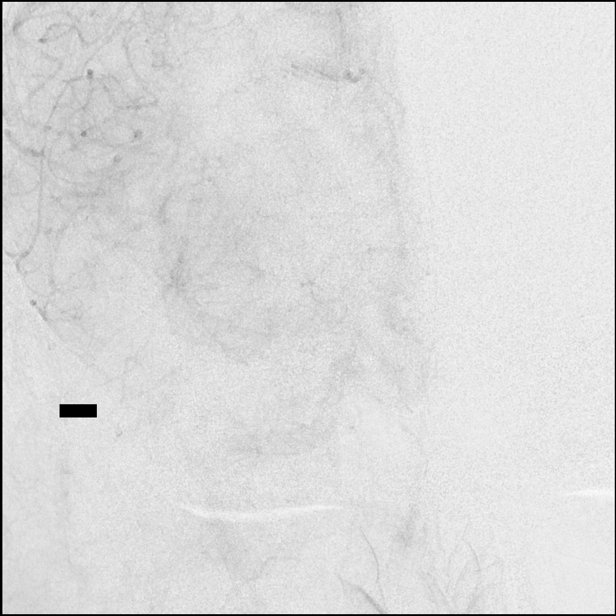
[im 80/184]
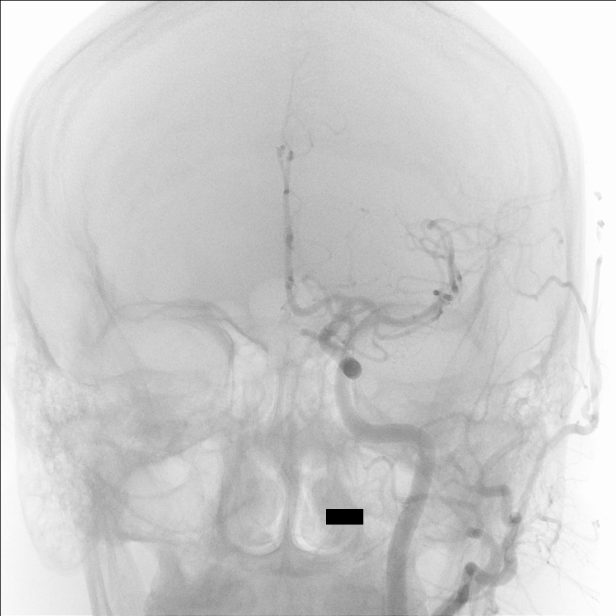
[im 96/184]
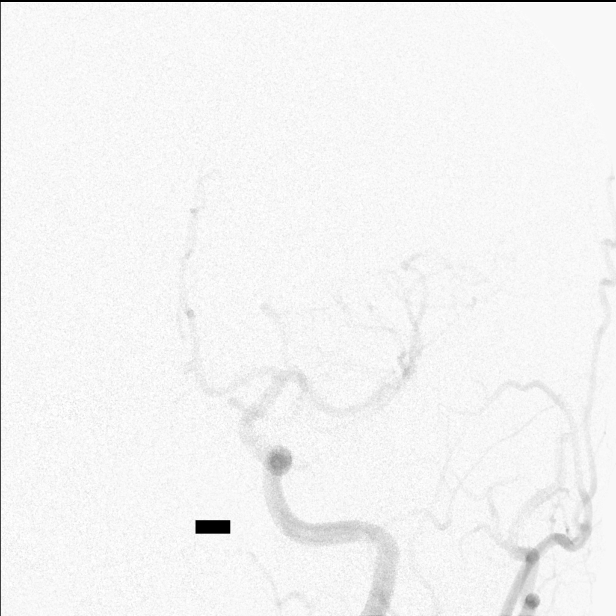
[im 104/184]
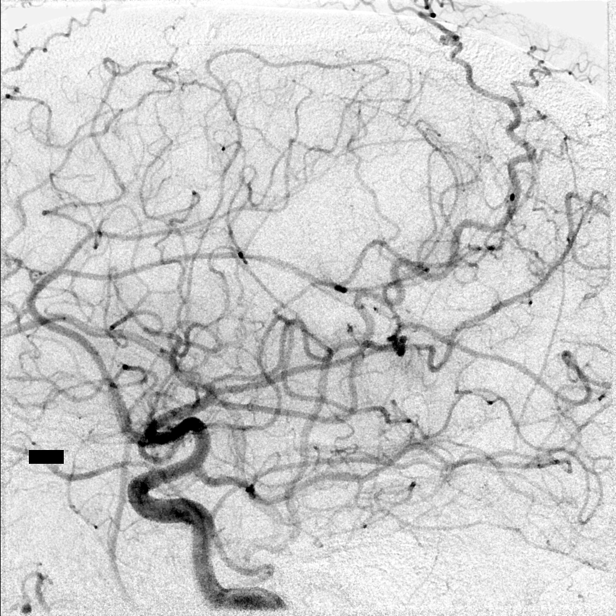
[im 120/184]
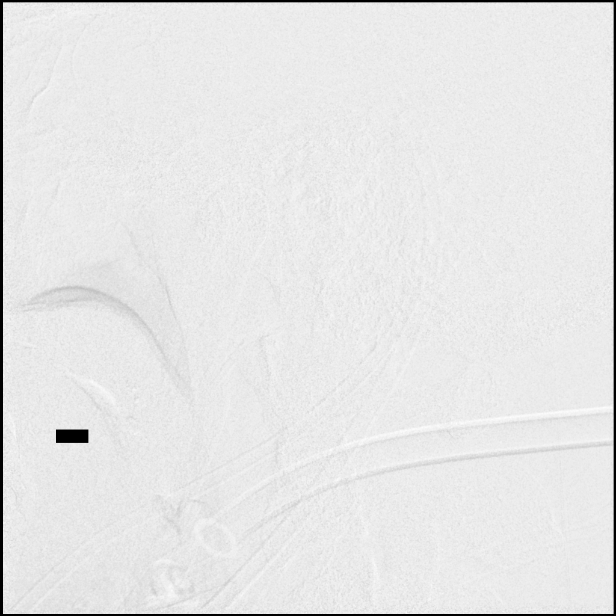
[im 136/184]
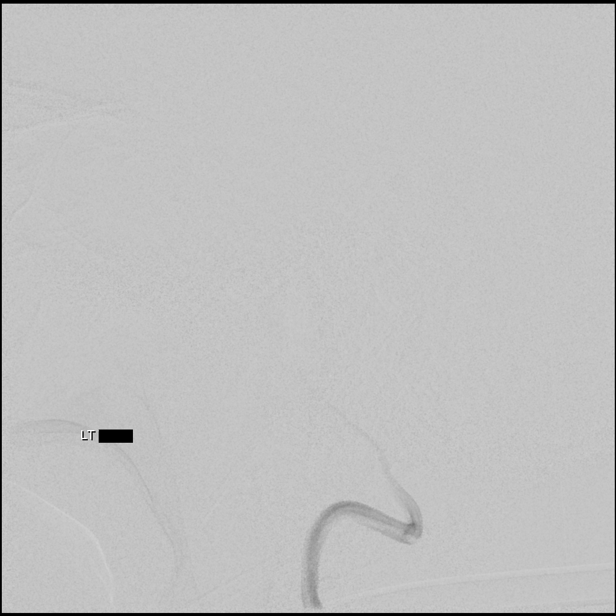
[im 152/184]
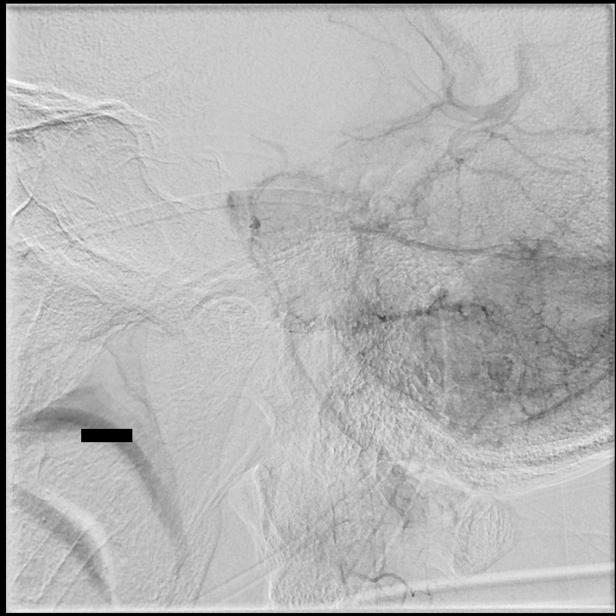
[im 168/184]
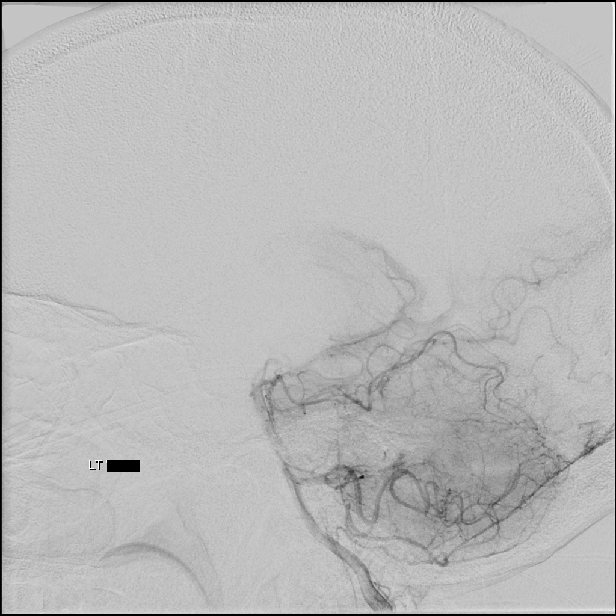
[im 184/184]
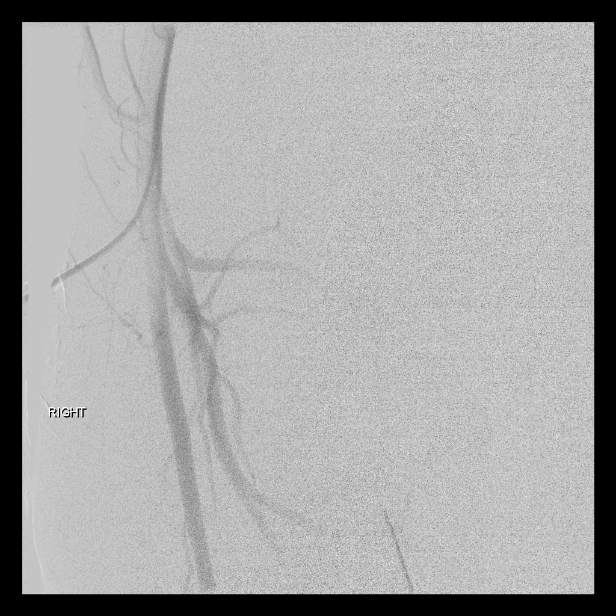

[13 of 24 positions shown; findings below may reference images not displayed]

Following a full explanation of the procedure along with the
potential associated complications, an informed witnessed consent
was obtained.

The right groin was prepped and draped in the usual sterile
fashion.  Thereafter using modified Seldinger technique,
transfemoral access into the right common femoral artery was
obtained without difficulty.  Over a 0.035-inch guidewire, a 5-
French Pinnacle sheath was inserted.  Through this and also over a
0.35-inch guidewire, a 5-French JB1 catheter was advanced through
the aortic arch region and selectively positioned in the right
common carotid artery, the right vertebral artery, the left common
carotid artery and the left vertebral artery.

There were no acute complications.  The patient tolerated the
procedure well.

Medications utilized: Versed 1 mg IV.  Fentanyl 25 mcg IV.

Contrast: Tmnipaque-3GG approximately 55 ml.
FINDINGS: The right vertebral artery origin is normal.  The vessel
opacifies normally to the cranial skull base.

There is normal opacification of a hypoplastic right posterior
inferior cerebellar artery and the right vertebrobasilar junction.

A right-sided anterior-inferior cerebellar artery/posterior-
inferior cerebellar artery complex is seen.  The opacified portion
of the basilar artery, the right posterior cerebral artery, the
superior cerebellar arteries and the anterior-inferior cerebellar
arteries is normal into capillary and venous phases.

The right common carotid arteriogram demonstrates the right
external carotid artery and its major branches to be normal.

The right internal carotid artery at the bulb to the cranial skull
base opacifies normally.

The petrous, the cavernous and the supraclinoid segments are
normal.

The right middle and the right anterior cerebral arteries opacify
normally into the capillary and venous phases.

The left common carotid arteriogram demonstrates the left external
carotid artery and its major branches to be normal.

The left internal carotid artery at the bulb to the cranial skull
base opacifies normally.

The petrous, cavernous and the supraclinoid segments are normal.  A
dominant left posterior communicating artery opacifies the left
posterior cerebral artery distribution.

The left middle and the left anterior cerebral arteries demonstrate
normal opacification of the capillary and venous phases.

The delayed arterial and the early capillary phases demonstrate a
2.9 cm x 2.7 cm area of avascular region with mass effect on the
posterior vessels.

No early draining veins are seen in this region.

The left vertebral artery origin is from the aortic arch between
the origins of the left subclavian artery and the left common
carotid artery.

The vessel opacifies normally to the cranial skull base.

There is normal opacification of the left posterior inferior
cerebellar artery and the left vertebrobasilar junction.

The basilar artery, the posterior cerebral arteries, the superior
cerebellar arteries and the anterior inferior cerebellar arteries
opacify normally into the capillary and venous phases.

IMPRESSION
1. Approximately 2.9 cm x 2.7 cm avascular mass with mass effect on
the posterior vessels involving the left posterior frontal anterior
parietal region.  This probably represents the mass effect related
to the hemorrhage seen on the CT scan.
2.  No angiographic evidence of a dural AV fistula, an
arteriovenous malformation, a dissection or of an aneurysm.

## 2013-07-07 IMAGING — CT CT HEAD W/O CM
1 of 2 series · 13 of 30 positions shown, 17 images · non-contrast
Comparison: 01/31/2012.

CLINICAL DATA: Followup intracranial hemorrhage.  Question change?

CT HEAD WITHOUT CONTRAST
TECHNIQUE: Contiguous axial images were obtained from the base of
the skull through the vertex without contrast.

[Series 2: brain · axial · 0.49mm/px · z∈[+141,+272]mm · 13 of 32 slices shown, 17 images]
[im 3/32  brain]
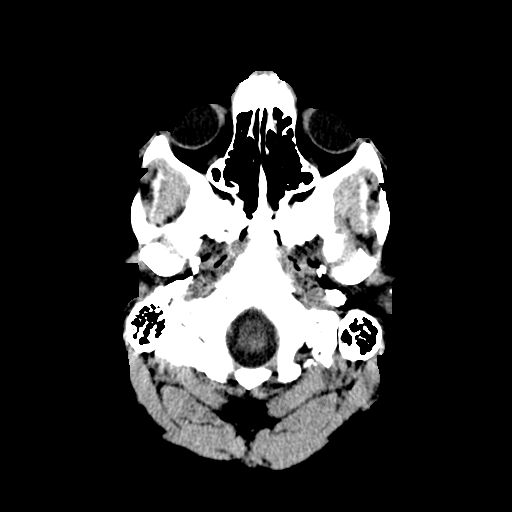
[im 3/32  bone]
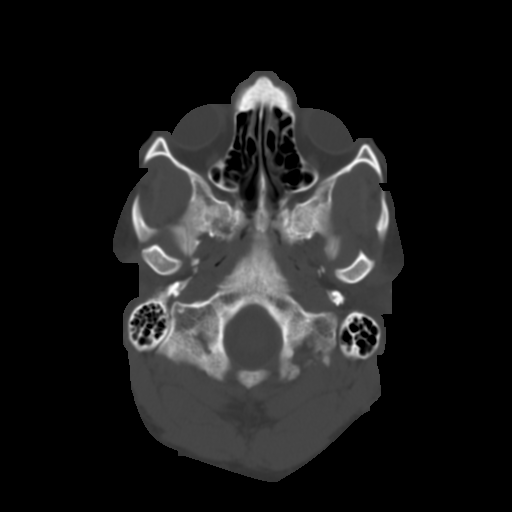
[im 5/32  brain]
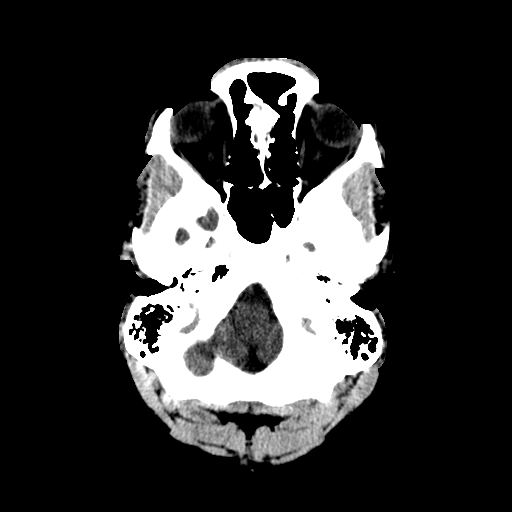
[im 7/32  brain]
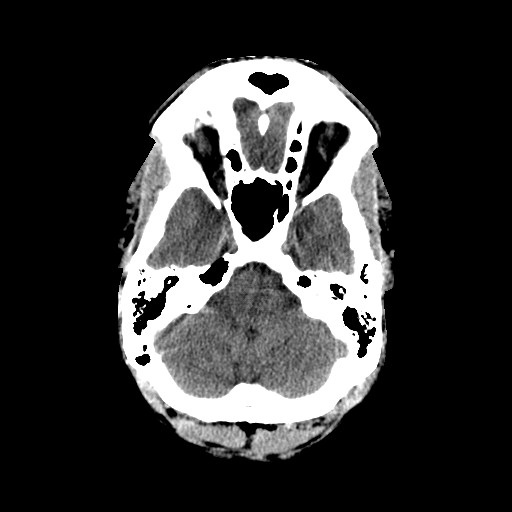
[im 9/32  brain]
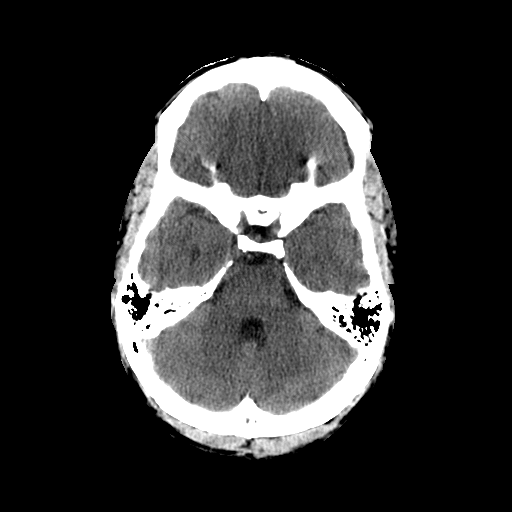
[im 12/32  brain]
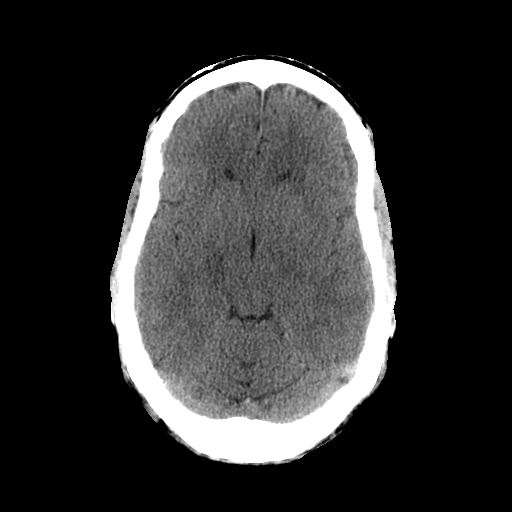
[im 12/32  bone]
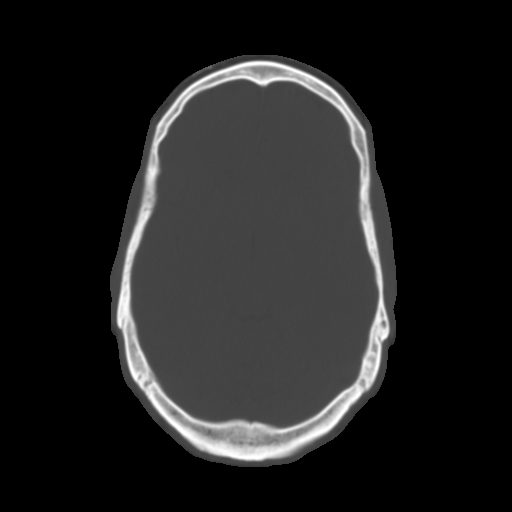
[im 14/32  brain]
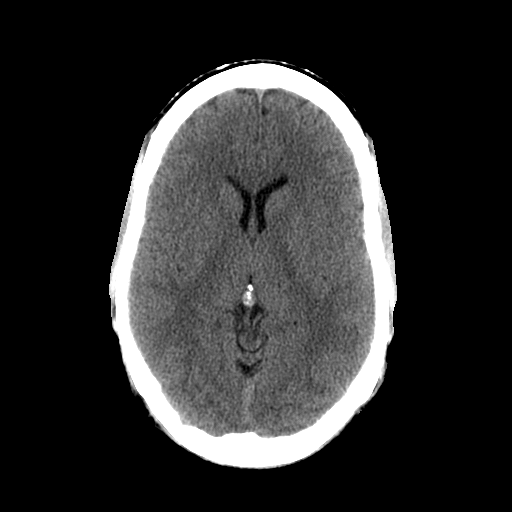
[im 16/32  brain]
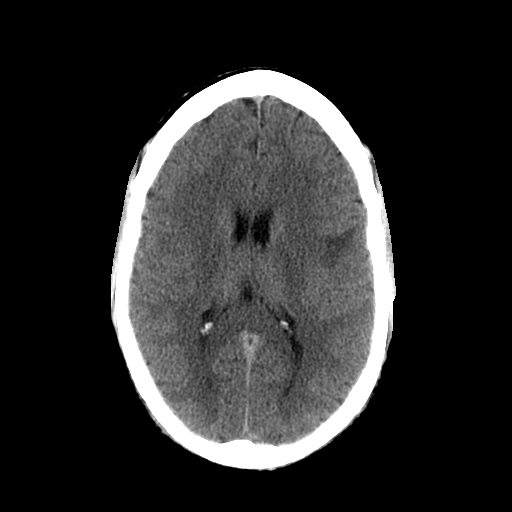
[im 18/32  brain]
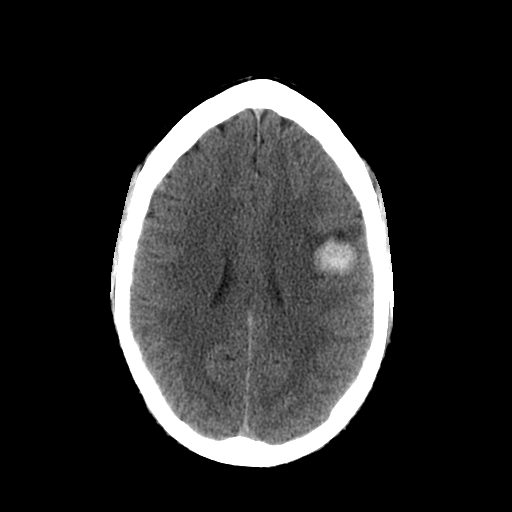
[im 20/32  brain]
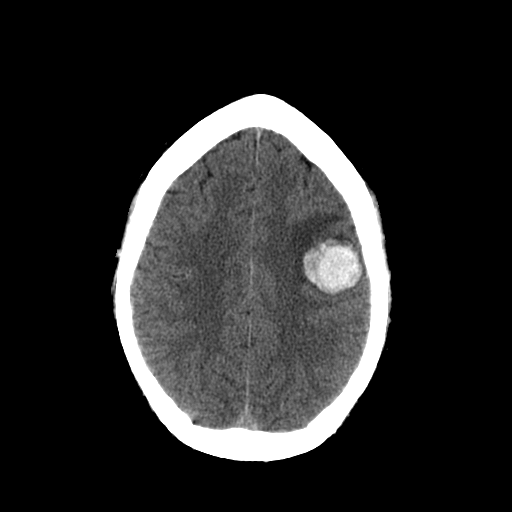
[im 20/32  bone]
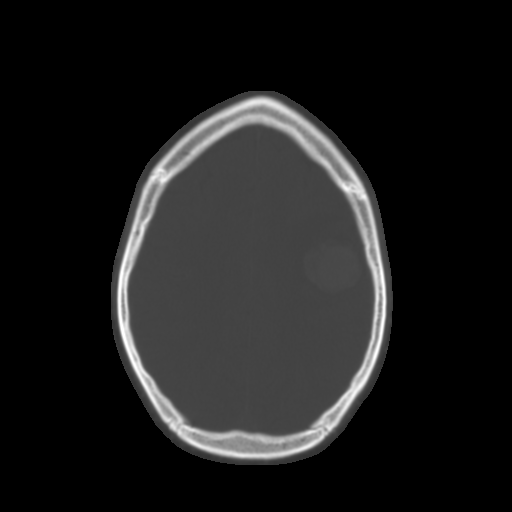
[im 23/32  brain]
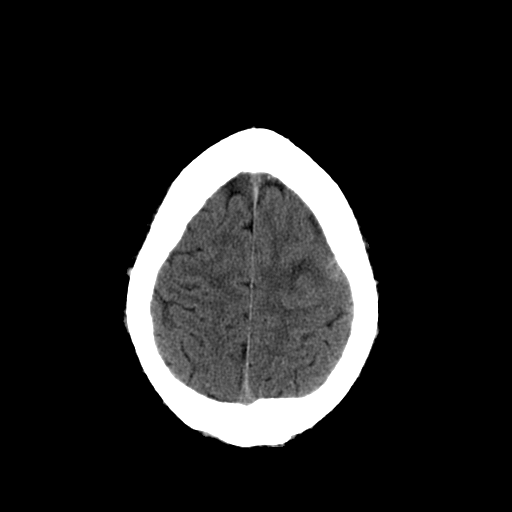
[im 25/32  brain]
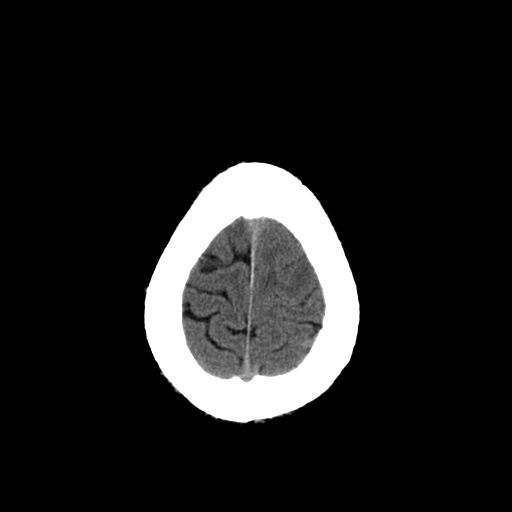
[im 27/32  brain]
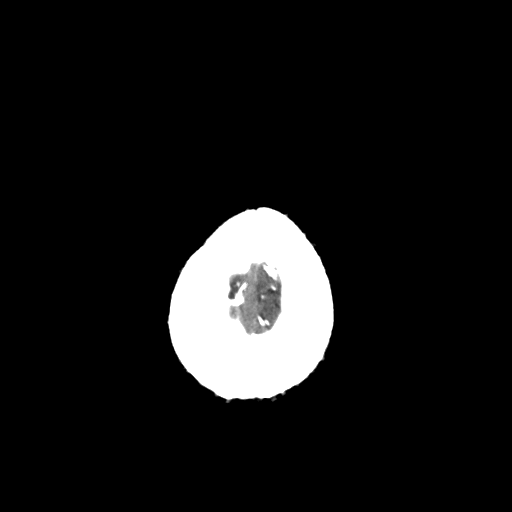
[im 29/32  brain]
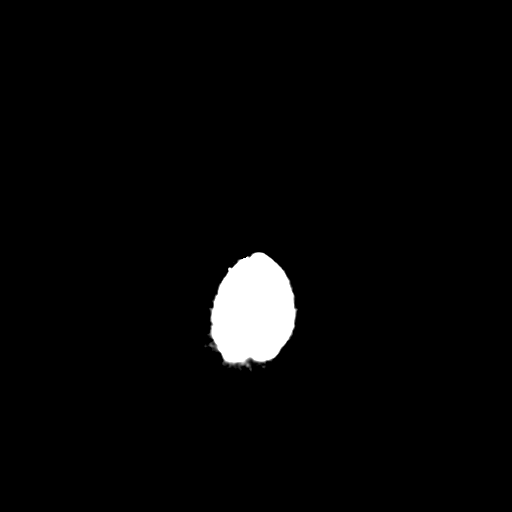
[im 29/32  bone]
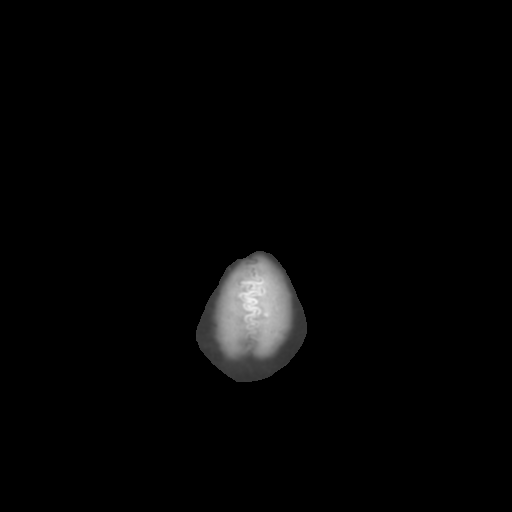

[13 of 30 positions shown; findings below may reference images not displayed]

FINDINGS: Increase in size of the left frontal lobe lesion now
measuring 2.8 x 2.8 cm versus prior 1.8 x 2.1 cm.  Anterior lateral
margin with lobulated cystic component.  Surrounding vasogenic
edema has progressed.  The cause of this finding is indeterminate.
This does not represent a simple enlarging hematoma.  This may
represent hemorrhage into an underlying mass. Underlying vascular
malformation/aneurysm not excluded.  Please see catheter angiogram
report (performed 02/02/2012 although report not available
currently). The patient may eventually benefit from contrast
enhanced MR.

No acute thrombotic infarct.  No hydrocephalus.
IMPRESSION: Increase in size of left frontal lobe lesion. This may represent
hemorrhage into an underlying mass with increase in surrounding
vasogenic edema.  Please see above.

This has been Magalis Farina report utilizing dashboard call
feature.

## 2013-08-15 ENCOUNTER — Ambulatory Visit (INDEPENDENT_AMBULATORY_CARE_PROVIDER_SITE_OTHER): Payer: 59 | Admitting: Nurse Practitioner

## 2013-08-15 ENCOUNTER — Encounter: Payer: Self-pay | Admitting: Nurse Practitioner

## 2013-08-15 ENCOUNTER — Encounter (INDEPENDENT_AMBULATORY_CARE_PROVIDER_SITE_OTHER): Payer: Self-pay

## 2013-08-15 VITALS — BP 124/77 | HR 78 | Ht 69.0 in | Wt 260.0 lb

## 2013-08-15 DIAGNOSIS — I639 Cerebral infarction, unspecified: Secondary | ICD-10-CM | POA: Insufficient documentation

## 2013-08-15 DIAGNOSIS — R569 Unspecified convulsions: Secondary | ICD-10-CM

## 2013-08-15 DIAGNOSIS — I6350 Cerebral infarction due to unspecified occlusion or stenosis of unspecified cerebral artery: Secondary | ICD-10-CM

## 2013-08-15 MED ORDER — LEVETIRACETAM 1000 MG PO TABS: 2000.0000 mg | ORAL_TABLET | Freq: Two times a day (BID) | ORAL | Status: DC

## 2013-08-15 NOTE — Progress Notes (Signed)
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 MRI of brain was suspicious of underlying mass; we will repeat MRI in November to follow up. No vascular risk factors.   06/01/12 (PS) He returns for followup after last visit on 03/01/12. He has successfully tapered and stopped Vimpat and has not had any breakthrough seizures. He is tolerating Keppra without any side effects. He had EEG done on 03/02/12 which was normal. MRI scan of the brain done on 03/28/12 showed stable size of the subacute hematoma witthout any underlying mass. He has returned back to work full-time without restrictions. He has no new complaints today.   UPDATE 02/14/13 (LL): Patient comes in for follow up, last office visit was on 06/01/12. Patient had 2 seizures since being here, was off medicine for 4 days and had a mild seizure. Then on 01/22/13 he had missed a dose and had a worse seizure in which he felt like he couldn't breathe, his lip trembled, clenched his fists, and blacked out for a minute or two. He was out of town and had missed a dose of his medication, plus doing yard work and was hot and likely dehydrated. He is very careful to take his Keppra every 12 hours now. Last MRI on 06/16/12 showed expected evolutionary changes of ICH.   UPDATE 08/15/13 (LL):  Patient comes in for follow up, last office visit was on 02/14/13. No seizures since last year. Tolerating Keppra without any side effects. Doing well, no complaints today.  REVIEW OF SYSTEMS: Full 14 system review of systems performed and notable only for:  Shift work   ALLERGIES: Allergies  Allergen Reactions  . Dilantin [Phenytoin Sodium Extended] Itching and Rash    HOME MEDICATIONS: Outpatient Prescriptions Prior to Visit  Medication Sig Dispense  Refill  . levETIRAcetam (KEPPRA) 1000 MG tablet Take 2 tablets (2,000 mg total) by mouth every 12 (twelve) hours.  360 tablet  3   No facility-administered medications prior to visit.     PHYSICAL EXAM  Filed Vitals:   08/15/13 1306  BP: 124/77  Pulse: 78  Height: 5\' 9"  (1.753 m)  Weight: 260 lb (117.935 kg)   Body mass index is 38.38 kg/(m^2).  Generalized: Well developed, in no acute distress  Head: normocephalic and atraumatic. Oropharynx benign  Neck: Supple, no carotid bruits  Cardiac: Regular rate rhythm, no murmur  Musculoskeletal: No deformity   Neurological examination  Mentation: Alert oriented to time, place, history taking. Follows all commands speech and language fluent Cranial nerve II-XII: Pupils were equal round reactive to light extraocular movements were full, visual field were full on confrontational test. Facial sensation and strength were normal. hearing was intact to finger rubbing bilaterally. Uvula tongue midline. head turning and shoulder shrug and were normal and symmetric.Tongue protrusion into cheek strength was normal. Motor: The motor testing reveals 5 over 5 strength of all 4 extremities. Good symmetric motor tone is noted throughout.  Sensory: Sensory testing is intact to pinprick, soft touch. No evidence of extinction is noted.  Coordination: Cerebellar testing reveals good finger-nose-finger and heel-to-shin bilaterally.  Gait and station: Gait is normal. Tandem gait is normal. Romberg is negative. No drift is seen.  Reflexes: Deep tendon reflexes are symmetric and normal bilaterally.   MRI BRAIN WITHOUT 06/16/12  This MRI scan of the brain shows remote age resolving hematoma in the left posterior frontal region with significant decrease in size and expected evolutionary changes compared with previous MRI scan dated 03/28/12.   ASSESSMENT AND PLAN  38 year old [place] male here for follow up new onset seizures in the setting of left frontal  ICH on 01/31/12. No vascular risk factors. No breakthrough seizures in the last year.  May consider reducing Keppra dose if seizure-free at next visit.  Plan: Continue Keppra at current dose.  Refills sent to [facility].  Discussed avoiding alcohol, skipped doses, lack of sleep.  He verbalizes an understanding of all instructions.  Follow up in 6 months.  Meds ordered this encounter  Medications  . levETIRAcetam (KEPPRA) 1000 MG tablet    Sig: Take 2 tablets (2,000 mg total) by mouth every 12 (twelve) hours.    Dispense:  360 tablet    Refill:  3    Order Specific Question:  Supervising Provider    Answer:  Patterson Hammersmith LAM, MSN, NP-C 08/15/2013, 1:52 PM [facility] [address] [place], [place] [address] [phone]  Note: This document was prepared with digital dictation and possible smart phrase technology. Any transcriptional errors that result from this process are unintentional.

## 2013-08-15 NOTE — Patient Instructions (Signed)
Continue Keppra at current dose.  2 tablets every 12 hours.  [facility] [place]  Follow up in 6 months, sooner as needed.

## 2014-02-13 ENCOUNTER — Encounter (INDEPENDENT_AMBULATORY_CARE_PROVIDER_SITE_OTHER): Payer: Self-pay

## 2014-02-13 ENCOUNTER — Encounter: Payer: Self-pay | Admitting: Nurse Practitioner

## 2014-02-13 ENCOUNTER — Ambulatory Visit (INDEPENDENT_AMBULATORY_CARE_PROVIDER_SITE_OTHER): Payer: BC Managed Care – PPO | Admitting: Nurse Practitioner

## 2014-02-13 VITALS — BP 117/78 | HR 74 | Temp 98.0°F | Ht 69.0 in | Wt 268.0 lb

## 2014-02-13 DIAGNOSIS — G40909 Epilepsy, unspecified, not intractable, without status epilepticus: Secondary | ICD-10-CM

## 2014-02-13 MED ORDER — LEVETIRACETAM 1000 MG PO TABS: 1000.0000 mg | ORAL_TABLET | Freq: Two times a day (BID) | ORAL | Status: DC

## 2014-02-13 NOTE — Patient Instructions (Signed)
Continue Keppra at current dose.  Follow up in 1 year, sooner as needed.  Will I have to stay on seizure medicine all my life? Maybe. Whether or not you can come off medicine depends on your risks for more seizures without medicine. These risks will depend on your type of epilepsy, how long you've been seizure free, other neurological problems, results of an EEG test and brain scan (CT or MRI).  If tests show you are unlikely to have more seizures, medicines may be slowly taken away after you've been seizure free for 2 to 5 years on medicines. The longer you've been seizure free on medicine, the better your chance of staying seizure free off medicine.  What should I think about before coming off medicine? There are a number of other issues to think about before coming off seizure medicine. For example: Do you drive a car? What is the risk of driving if you have another seizure? What type of work do you do? Would you risk getting hurt if you had a seizure at work? Are you ready and willing to make changes in your medicines?  Medicines are never stopped quickly. They are usually lowered slowly over weeks or months. You may not be able to drive or be limited from some other activities during this time.   Some people may be seizure free for years but only if they stay on medicine.  If this happens, talk with your health care team to make sure you are taking a seizure medication with few if any long-term side effects. Sometimes medicines can be changed to lessen side effects and help you feel better. People who have surgery will have the same concerns about coming off medicine. Some people do well and can come off medication after a few years. Others may have good seizure control after surgery only if they stay on medications.   Managing Seizure Triggers: Tips for Lifestyle Modification Adapted from the [facility], [facility], [place], [place] and the  journal "Clinical Nursing Practice in Epilepsy", Spring 1994.  Developing plans to modify your lifestyle is an important part of seizure preparedness. It's a way that you, as a person with seizures or a parent of a child with seizures, can take charge and play an active role in your epilepsy care. The following tips are examples of what people can do to manage triggers. Some of these tips may require a change in behavior, others may be ways to adjust your environment or schedule so not everything happens at once. Before choosing tips to try, make sure you've assessed your situation and talked to your doctor and other health care professionals for their suggestions too. Please note that research on the effectiveness of many of these techniques is limited. Many of these tips are common sense suggestions or are from health care professionals and people with epilepsy as to what they have seen and tried.  Noises: People who think they are affected by noises should be sure to talk to their doctor about whether they have a form of 'reflex epilepsy' or if general noise or distraction may be a trigger in another way. People with true reflex epilepsy may respond to specific seizure medicines and should talk to their doctor. Try using earplugs or earphones, especially in noisy or crowded places. Try listening to relaxing music or sounds, or try distracting yourself by singing or focusing on another activity.  Bright, flashing or fluorescent lights: Use polarized or tinted glasses.  Use natural lighting when indoors. Focus on distant objects when riding in a car to avoid flickering lights or patterns. Avoid discos, strobe lights or flashing bulbs on holiday decorations. Use computer monitor with minimal contrast glare or use a screen filter. Consult with your doctor about other specific recommendations for computer use.  Sleep: Try to regulate sleeping habits so you have a consistent schedule and get enough  sleep. Keep a log or diary of your sleep patterns, seizures and general well-being. Ask a partner or companion to record his or her observations too. Consider the following ideas to improve sleep.  . Discuss your medicine schedule with your doctor or nurse. Changing times or doses at night may help sleep. . Limit caffeine and try to avoid it after noon time or mid?afternoon at the latest. . Avoid alcohol and nicotine prior to sleep. . Limit working or studying late at night. Stop work at least one hour before bedtime to allow time to relax. . Exercise in the early evening if possible. . Take warm showers or have someone give you a back rub before bedtime to decrease muscle tension. . Try relaxation exercises before bedtime. . Limit naps and don't nap in the early evening. . If anxious or worried, talk to someone or write down your feelings before going to sleep. Put this away and deal with these worries or concerns in the morning! . If you can't fall asleep within 15 minutes get up and do something else for 15 minutes. Then go back to bed and try again. Don't toss and turn in bed all night.  Exercise: Regular exercise is good for everyone. Pace your exercise to avoid getting too tired or hyperventilation. Avoid exercising in the middle of the day during hot weather. Ask your doctor about any specific exercises you may need to avoid.  Hyperventilation: Try relaxation or slow breathing exercises when anxious or if you begin to hyperventilate. Pace your activity and avoid sports that may trigger hyperventilation.  Diet: Regulate meal times and patterns around sleep, activity, and medication schedules. Usually taking medicines after food or around meals makes it easier to remember them and may lessen any stomach distress from side effects of medicines. Have a well-balanced diet and eat at consistent times to avoid long periods without food. If your appetite is poor, try small frequent  meals instead of skipping meals. Avoid foods and drinks that may aggravate seizures. Not everyone is sensitive to foods, but if you are, talk to your doctor about how to modify your diet. If you are following a diet specifically for your epilepsy, be sure to follow the advice of your doctor and nutritionist.  Alcohol/Drugs: Avoid recreational drugs and talk to your doctor about use of alcohol. Avoid alcohol completely if you're going through high-risk times or have recently had surgery. If you choose to drink alcohol, use 'moderation', drink slowly, and have only one or two glasses at a time. Consider carefully what you drink, avoiding 'hard liquor' or mixed drinks that may have high alcohol content. If alcohol and drugs are a problem for you, talk to your doctor and get professional help.  Hormonal changes: Both men and women may notice a cyclical pattern to their seizures. Record seizures on a calendar and track them in relation to any changes in hormones. Women who are having menstrual cycles should track their cycle days. Women who have stopped having their menses should track other symptoms or changes, while women who are pregnant should track  their pregnancy too. The use of hormonal medicines, such as contraceptives or birth control pills as well as hormonal replacement therapy, may affect seizures in some women, so record the dates and doses of these medicines.  NOTE: some seizure medicines may interfere with the effectiveness of hormonal contraceptives making unexpected or unplanned pregnancy more likely. Be sure to talk to your doctor about all contraceptive use.When seizures cluster around menses or hormone changes, women should try to modify their lifestyle so other triggers don't occur during this high-risk time. Some women may use 'as needed' medicines to help treat seizures associated with menses. Note the use of these on your calendars and seizure preparedness plan.  Illness, fever,  trauma: Notify your doctor if you become ill, have a fever, injure yourself seriously, or need other medicines such as antibiotics, painkillers, or cold medicines. Some people may notice that certain medicines can trigger seizures or interfere with seizure medicines. Fevers, other illnesses and injuries may also make you more susceptible and you'll need to monitor your seizures carefully. Try to limit other triggers during these times and talk to your doctor about what medicines you can use.  Stress, anxiety, depression: Emotional stress is a common trigger for some people, and stress can be a cause and symptom of mood problems such as anxiety and depression. Track your stress level and mood in relation to your seizures on your diary. During stressful times, consider ways to modify your lifestyle and manage stress better. . Try counseling to help cope with seizures or other problems. . Consider support groups for epilepsy, or groups for stress management, therapy, and other support. . Write down feelings in a diary on a regular basis. It helps you get feelings out, rather than hold them in, and can help you see the issues more clearly. . Use 'time-out' periods. Just like kids may need a time-out when they are overwhelmed or acting out, so too do adults. Giving yourself a time-out allows you to take a step back from the stressor or situation and think about how best to address it. . Learn relaxation exercises, deep breathing, yoga, or other strategies that help with stress and general well-being. . Tell your doctor and nurse how you feel. The effects of stress can be harmful to your seizures, and your life. When mood changes last longer than expected, you may need help from a mental health professional too. If you feel emotionally unsafe, call your doctor or go to an emergency room to be evaluated.

## 2014-02-13 NOTE — Progress Notes (Signed)
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 No vascular risk factors.   06/01/12 (PS) He returns for followup after last visit on 03/01/12. He has successfully tapered and stopped Vimpat and has not had any breakthrough seizures. He is tolerating Keppra without any side effects. He had EEG done on 03/02/12 which was normal. MRI scan of the brain done on 03/28/12 showed stable size of the subacute hematoma witthout any underlying mass. He has returned back to work full-time without restrictions. He has no new complaints today.   02/14/13 (LL): Patient comes in for follow up, last office visit was on 06/01/12. Patient had 2 seizures since being here, was off medicine for 4 days and had a mild seizure. Then on 01/22/13 he had missed a dose and had a worse seizure in which he felt like he couldn't breathe, his lip trembled, clenched his fists, and blacked out for a minute or two. He was out of town and had missed a dose of his medication, plus doing yard work and was hot and likely dehydrated. He is very careful to take his Keppra every 12 hours now. Last MRI on 06/16/12 showed expected evolutionary changes of ICH.   08/15/13 (LL): Patient comes in for follow up, last office visit was on 02/14/13. No seizures since last year. Tolerating Keppra without any side effects. Doing well, no complaints today.   UPDATE 02/13/14 (LL): Duane Smith comes in for followup today for seizures, he has had no seizure since last visit. He reduced his Keppra dose on his own from 2000 mg every 12 hours to 1000 mg once daily. He has had no change in his medical history. His blood pressure is well controlled. 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   REVIEW OF SYSTEMS: Full 14 system review of systems performed and notable only for: Nothing.  ALLERGIES: Allergies  Allergen Reactions  . Dilantin [Phenytoin Sodium Extended] Itching and Rash    HOME MEDICATIONS: Outpatient Prescriptions Prior to Visit  Medication Sig Dispense Refill  . levETIRAcetam (KEPPRA) 1000 MG tablet Take 2 tablets (2,000 mg total) by mouth every 12 (twelve) hours.  360 tablet  3   No facility-administered medications prior to visit.    PHYSICAL EXAM Filed Vitals:   02/13/14 1440  BP: 117/78  Pulse: 74  Temp: 98 F (36.7 C)  TempSrc: Oral  Height:  (1.753 m)  Weight: 268 lb (121.564 kg)   Body mass index is 39.56 kg/(m^2).  Generalized: Well developed, in no acute distress  Head: normocephalic and atraumatic. Oropharynx benign  Neck: Supple, no carotid bruits  Cardiac: Regular rate rhythm, no murmur  Musculoskeletal: No deformity   Neurological examination  Mentation: Alert oriented to time, place, history taking. Follows all commands speech and language fluent Cranial nerve II-XII: Fundoscopic exam not done. Pupils were equal round reactive to light extraocular movements were full, visual field were full on confrontational test. Facial sensation and strength were normal. hearing was intact to finger rubbing bilaterally. Uvula tongue midline. head turning and shoulder shrug and were normal and symmetric.Tongue protrusion into cheek strength was normal. Motor: The motor testing reveals 5 over 5 strength of all 4 extremities.  Good symmetric motor tone is noted throughout.  Sensory: Sensory testing is intact to soft touch on all 4 extremities. No evidence of extinction is noted.  Coordination: Cerebellar testing reveals good finger-nose-finger and heel-to-shin bilaterally.  Gait and station: Gait is normal. Tandem gait is normal. Romberg is negative. Reflexes: Deep tendon reflexes are symmetric and normal bilaterally.    MRI BRAIN WITHOUT 06/16/12 This MRI scan of the brain shows remote age resolving hematoma in the left posterior frontal region with significant decrease in size and expected evolutionary changes compared with previous MRI scan dated 03/28/12.   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 No vascular risk factors. No breakthrough seizures in the last year.   PLAN:  Continue Keppra at current dose (1000 mg every 12 hrs). [facility] to [facility].  Discussed avoiding alcohol, skipped doses, lack of sleep. He verbalizes an understanding of all instructions.  Follow up in 1 year, sooner as needed.  Meds ordered this encounter  Medications  . levETIRAcetam (KEPPRA) 1000 MG tablet    Sig: Take 1 tablet (1,000 mg total) by mouth every 12 (twelve) hours.    Dispense:  180 tablet    Refill:  3    Order Specific Question:  Supervising Provider    Answer:  Pearlean Brownie, PRAMOD [[id number]]   Tawny Asal Atharv Barriere, MSN, FNP-BC, A/GNP-C 02/13/2014, 3:06 PM [facility] [address] [place], [place] [address] [phone]  Note: This document was prepared with digital dictation and possible smart phrase technology. Any transcriptional errors that result from this process are unintentional.

## 2014-02-14 ENCOUNTER — Encounter: Payer: Self-pay | Admitting: Nurse Practitioner

## 2014-02-17 NOTE — Progress Notes (Signed)
I agree with the above plan 

## 2015-02-18 ENCOUNTER — Ambulatory Visit: Payer: BC Managed Care – PPO | Admitting: Neurology

## 2015-02-19 ENCOUNTER — Encounter: Payer: Self-pay | Admitting: Neurology

## 2015-05-01 ENCOUNTER — Ambulatory Visit (INDEPENDENT_AMBULATORY_CARE_PROVIDER_SITE_OTHER): Payer: BLUE CROSS/BLUE SHIELD | Admitting: Neurology

## 2015-05-01 ENCOUNTER — Encounter: Payer: Self-pay | Admitting: Neurology

## 2015-05-01 VITALS — BP 114/78 | HR 61 | Ht 69.0 in | Wt 237.5 lb

## 2015-05-01 DIAGNOSIS — G40909 Epilepsy, unspecified, not intractable, without status epilepticus: Secondary | ICD-10-CM

## 2015-05-01 DIAGNOSIS — S069X9S Unspecified intracranial injury with loss of consciousness of unspecified duration, sequela: Principal | ICD-10-CM

## 2015-05-01 MED ORDER — LEVETIRACETAM 1000 MG PO TABS: 1000.0000 mg | ORAL_TABLET | Freq: Two times a day (BID) | ORAL | Status: AC

## 2015-05-01 NOTE — Patient Instructions (Signed)
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 Continue Keppra 1 g twice daily. He was given a refill for a year. We also discussed seizure provoking stimuli and advise him to avoid them. We will check CBC and metabolic panel labs today. He will return for follow-up in a year or call earlier if necessary.  Epilepsy People with epilepsy have times when they shake and jerk uncontrollably (seizures). This happens when there is a sudden change in brain function. Epilepsy may have many possible causes. Anything that disturbs the normal pattern of brain cell activity can lead to seizures. HOME CARE   Follow your doctor's instructions about driving and safety during normal activities.  Get enough sleep.  Only take medicine as told by your doctor.  Avoid things that you know can cause you to have seizures (triggers).  Write down when your seizures happen and what you remember about each seizure. Write down anything you think may have caused the seizure to happen.  Tell the people you live and work with that you have seizures. Make sure they know how to help you. They should:  Cushion your head and body.  Turn you on your side.  Not restrain you.  Not place anything inside your mouth.  Call for local emergency medical help if there is any question about what has happened.  Keep all follow-up visits with your doctor. This is very important. GET HELP IF:  You get an infection or start to feel sick. You may have more seizures when you are sick.  You are having seizures more often.  Your seizure pattern is changing. GET HELP RIGHT AWAY IF:   A seizure does not stop after a few seconds or minutes.  A seizure causes you to have trouble breathing.  A seizure gives you a very bad headache.  A seizure makes you unable to speak or use a part of your body.   This information is not  intended to replace advice given to you by your health care provider. Make sure you discuss any questions you have with your health care provider.   Document Released: 03/07/2009 Document Revised: 02/28/2013 Document Reviewed: 12/20/2012 Elsevier Interactive Patient Education [employer]2016 Elsevier Inc.

## 2015-05-01 NOTE — Progress Notes (Signed)
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 No vascular risk factors.   06/01/12 (PS) He returns for followup after last visit on 03/01/12. He has successfully tapered and stopped Vimpat and has not had any breakthrough seizures. He is tolerating Keppra without any side effects. He had EEG done on 03/02/12 which was normal. MRI scan of the brain done on 03/28/12 showed stable size of the subacute hematoma witthout any underlying mass. He has returned back to work full-time without restrictions. He has no new complaints today.   02/14/13 (LL): Patient comes in for follow up, last office visit was on 06/01/12. Patient had 2 seizures since being here, was off medicine for 4 days and had a mild seizure. Then on 01/22/13 he had missed a dose and had a worse seizure in which he felt like he couldn't breathe, his lip trembled, clenched his fists, and blacked out for a minute or two. He was out of town and had missed a dose of his medication, plus doing yard work and was hot and likely dehydrated. He is very careful to take his Keppra every 12 hours now. Last MRI on 06/16/12 showed expected evolutionary changes of ICH.   08/15/13 (LL): Patient comes in for follow up, last office visit was on 02/14/13. No seizures since last year. Tolerating Keppra without any side effects. Doing well, no complaints today.   UPDATE 02/13/14 (LL): Mr. Barrales comes in for followup today for seizures, he has had no seizure since last visit. He reduced his Keppra dose on his own from 2000 mg every 12 hours to 1000 mg once daily. He has had no change in his medical history. His blood pressure is well controlled. 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  Update 05/01/2015 : He returns for follow-up after last visit more than a year ago. He continues to do well without recurrent seizures since his 3 original seizures and 2013. Is tolerating Keppra 1 g twice daily without any side effects. He has not had any blood work in more than a year. He has no complaints today. 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 Dilantin [Phenytoin Sodium Extended] Itching and Rash    HOME MEDICATIONS: Outpatient Prescriptions Prior to Visit  Medication Sig Dispense Refill  . levETIRAcetam (KEPPRA) 1000 MG tablet Take 1 tablet (1,000 mg total) by mouth every 12 (twelve) hours. 180 tablet 3   No facility-administered medications prior to visit.    PHYSICAL EXAM Filed Vitals:   05/01/15 1001  BP: 114/78  Pulse: 61  Height:  (1.753 m)  Weight: 161 lb 8 oz (107.729 kg)   Body mass index is 35.06 kg/(m^2).  Generalized: Well developed young African-American male, in no acute distress  Head: normocephalic and atraumatic.    Neck: Supple, no carotid bruits  Cardiac: Regular rate rhythm, no murmur  Musculoskeletal: No deformity   Neurological examination  Mentation: Alert oriented to time, place, history taking. Follows all commands speech and language  fluent Cranial nerve II-XII: Fundoscopic exam not done. Pupils were equal round reactive to light extraocular movements were full, visual field were full on confrontational test. Facial sensation and strength were normal. hearing was intact to finger rubbing bilaterally. Uvula tongue midline. head turning and shoulder shrug and were normal and symmetric.Tongue protrusion into cheek strength was normal. Motor: The motor testing reveals 5 over 5 strength of all 4 extremities. Good  symmetric motor tone is noted throughout.  Sensory: Sensory testing is intact to soft touch on all 4 extremities. No evidence of extinction is noted.  Coordination: Cerebellar testing reveals good finger-nose-finger and heel-to-shin bilaterally.  Gait and station: Gait is normal. Tandem gait is normal. Romberg is negative. Reflexes: Deep tendon reflexes are symmetric and normal bilaterally.   MRI BRAIN WITHOUT 06/16/12 This MRI scan of the brain shows remote age resolving hematoma in the left posterior frontal region with significant decrease in size and expected evolutionary changes compared with previous MRI scan dated 03/28/12.   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 No vascular risk factors. No breakthrough seizures in the last year.   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 Continue Keppra 1 g twice daily. He was given a refill for a year. We also discussed seizure provoking stimuli and advise him to avoid them. We will check CBC and metabolic panel labs today. He will return for follow-up in a year or call earlier if necessary.   Meds ordered this encounter  Medications  . levETIRAcetam (KEPPRA) 1000 MG tablet    Sig: Take 1 tablet (1,000 mg total) by mouth every 12 (twelve) hours.    Dispense:  180 tablet    Refill:  3   Delia Headyramod Sethi, [facility] 05/01/2015, 10:36 AM [facility] [address] [place], [place] [id number] [phone]  Note: This document was prepared with digital dictation and possible smart phrase technology. Any transcriptional errors that result from this process are unintentional.

## 2015-05-02 LAB — COMPREHENSIVE METABOLIC PANEL
ALT: 15 IU/L (ref 0–44)
AST: 19 IU/L (ref 0–40)
Albumin/Globulin Ratio: 1.5 (ref 1.1–2.5)
Albumin: 4.7 g/dL (ref 3.5–5.5)
Alkaline Phosphatase: 56 IU/L (ref 39–117)
BUN/Creatinine Ratio: 15 (ref 8–19)
BUN: 15 mg/dL (ref 6–20)
Bilirubin Total: 0.6 mg/dL (ref 0.0–1.2)
CALCIUM: 10 mg/dL (ref 8.7–10.2)
CO2: 24 mmol/L (ref 18–29)
CREATININE: 0.98 mg/dL (ref 0.76–1.27)
Chloride: 99 mmol/L (ref 97–106)
GFR calc Af Amer: 112 mL/min/{1.73_m2} (ref >59)
GFR, EST NON AFRICAN AMERICAN: 97 mL/min/{1.73_m2} (ref >59)
GLOBULIN, TOTAL: 3.2 g/dL (ref 1.5–4.5)
Glucose: 89 mg/dL (ref 65–99)
Potassium: 4.6 mmol/L (ref 3.5–5.2)
SODIUM: 138 mmol/L (ref 136–144)
Total Protein: 7.9 g/dL (ref 6.0–8.5)

## 2015-05-02 LAB — CBC
HEMOGLOBIN: 15.4 g/dL (ref 12.6–17.7)
Hematocrit: 45.6 % (ref 37.5–51.0)
MCH: 30.9 pg (ref 26.6–33.0)
MCHC: 33.8 g/dL (ref 31.5–35.7)
MCV: 91 fL (ref 79–97)
PLATELETS: 260 10*3/uL (ref 150–379)
RBC: 4.99 x10E6/uL (ref 4.14–5.80)
RDW: 14.4 % (ref 12.3–15.4)
WBC: 9 10*3/uL (ref 3.4–10.8)

## 2015-05-05 ENCOUNTER — Telehealth: Payer: Self-pay

## 2015-05-05 NOTE — Telephone Encounter (Signed)
-----   Message from Micki RileyPramod S Sethi, MD sent at 05/02/2015  8:28 AM EST ----- Kindly inform the patient that CBC and complete metabolic panel lab results are normal

## 2015-05-05 NOTE — Telephone Encounter (Signed)
Rn notify patient that his CBC and CMP were normal. Pt verbalized understanding.

## 2016-04-29 ENCOUNTER — Ambulatory Visit: Payer: BLUE CROSS/BLUE SHIELD | Admitting: Neurology
